# Patient Record
Sex: Male | Born: 1975 | Race: White | Hispanic: No | Marital: Married | Smoking: Never smoker
Health system: Southern US, Community
[De-identification: ages and names within clinical notes are randomized; demographics above are authoritative.]

## PROBLEM LIST (undated history)

## (undated) DIAGNOSIS — K7460 Unspecified cirrhosis of liver: Secondary | ICD-10-CM

## (undated) DIAGNOSIS — J45909 Unspecified asthma, uncomplicated: Secondary | ICD-10-CM

## (undated) DIAGNOSIS — K766 Portal hypertension: Secondary | ICD-10-CM

## (undated) DIAGNOSIS — E119 Type 2 diabetes mellitus without complications: Secondary | ICD-10-CM

## (undated) DIAGNOSIS — D696 Thrombocytopenia, unspecified: Secondary | ICD-10-CM

## (undated) DIAGNOSIS — N281 Cyst of kidney, acquired: Secondary | ICD-10-CM

## (undated) DIAGNOSIS — Z8489 Family history of other specified conditions: Secondary | ICD-10-CM

## (undated) DIAGNOSIS — I8500 Esophageal varices without bleeding: Secondary | ICD-10-CM

## (undated) DIAGNOSIS — I10 Essential (primary) hypertension: Secondary | ICD-10-CM

## (undated) HISTORY — DX: Cyst of kidney, acquired: N28.1

## (undated) HISTORY — DX: Portal hypertension: K76.6

## (undated) HISTORY — DX: Essential (primary) hypertension: I10

## (undated) HISTORY — DX: Thrombocytopenia, unspecified: D69.6

## (undated) HISTORY — DX: Unspecified cirrhosis of liver: K74.60

## (undated) HISTORY — DX: Esophageal varices without bleeding: I85.00

## (undated) HISTORY — PX: UPPER GASTROINTESTINAL ENDOSCOPY: SHX188

## (undated) HISTORY — PX: HERNIA REPAIR: SHX51

## (undated) HISTORY — PX: WISDOM TOOTH EXTRACTION: SHX21

## (undated) HISTORY — DX: Unspecified asthma, uncomplicated: J45.909

## (undated) HISTORY — PX: COLONOSCOPY: SHX174

## (undated) HISTORY — DX: Type 2 diabetes mellitus without complications: E11.9

---

## 2000-05-18 ENCOUNTER — Encounter: Payer: Self-pay | Admitting: Emergency Medicine

## 2000-05-18 ENCOUNTER — Emergency Department (HOSPITAL_COMMUNITY): Admission: EM | Admit: 2000-05-18 | Discharge: 2000-05-18 | Payer: Self-pay | Admitting: Emergency Medicine

## 2008-05-16 ENCOUNTER — Encounter: Admission: RE | Admit: 2008-05-16 | Discharge: 2008-08-14 | Payer: Self-pay | Admitting: Occupational Medicine

## 2008-06-19 ENCOUNTER — Encounter: Admission: RE | Admit: 2008-06-19 | Discharge: 2008-06-20 | Payer: Self-pay | Admitting: Sports Medicine

## 2011-01-05 ENCOUNTER — Other Ambulatory Visit: Payer: Self-pay | Admitting: Family Medicine

## 2011-01-08 ENCOUNTER — Ambulatory Visit
Admission: RE | Admit: 2011-01-08 | Discharge: 2011-01-08 | Disposition: A | Discharge status: Home / Self Care | Payer: Managed Care, Other (non HMO) | Source: Ambulatory Visit | Attending: Family Medicine | Admitting: Family Medicine

## 2011-04-30 ENCOUNTER — Ambulatory Visit (INDEPENDENT_AMBULATORY_CARE_PROVIDER_SITE_OTHER): Payer: Managed Care, Other (non HMO) | Admitting: Internal Medicine

## 2011-04-30 DIAGNOSIS — H6120 Impacted cerumen, unspecified ear: Secondary | ICD-10-CM

## 2011-04-30 DIAGNOSIS — H918X9 Other specified hearing loss, unspecified ear: Secondary | ICD-10-CM

## 2011-04-30 DIAGNOSIS — H9209 Otalgia, unspecified ear: Secondary | ICD-10-CM

## 2011-05-01 NOTE — Progress Notes (Signed)
  Subjective:    Patient ID: Caleb Baker, male    DOB: 1975-07-29, 36 y.o.   MRN: [id number]  HPICan't hear out of right ear for the last 24 hours/feels like there is a problem on the left but not as bad Pressure but no real pain on the right/no history of recent infections or allergies Has had an episode like this in the past whe're was due to wax    Review of Systems     Objective:   Physical Exam Vital signs are stable and his person on lisinopril for hypertension except for weight 242 pounds The right external auditory canal is occluded by wax totally The left canal is partially occluded with an abundance of wax      Procedure: Both ears were irrigated successfully Postprocedure  exam showed both canals to be clear and tympanic membranes to be intact Assessment & Plan:  Problem #1 cerumen impaction causing ear symptoms  Since this is a recurrent problem I advised 4 drops of mineral oil in each ear once a week

## 2011-09-24 ENCOUNTER — Ambulatory Visit: Payer: Managed Care, Other (non HMO)

## 2011-09-24 ENCOUNTER — Ambulatory Visit (INDEPENDENT_AMBULATORY_CARE_PROVIDER_SITE_OTHER): Payer: Managed Care, Other (non HMO) | Admitting: Emergency Medicine

## 2011-09-24 VITALS — BP 122/81 | HR 86 | Temp 98.6°F | Resp 16 | Ht 72.5 in | Wt 260.0 lb

## 2011-09-24 DIAGNOSIS — M25539 Pain in unspecified wrist: Secondary | ICD-10-CM

## 2011-09-24 DIAGNOSIS — S63509A Unspecified sprain of unspecified wrist, initial encounter: Secondary | ICD-10-CM

## 2011-09-24 MED ORDER — NAPROXEN SODIUM 550 MG PO TABS: 550.0000 mg | ORAL_TABLET | Freq: Two times a day (BID) | ORAL | Status: AC

## 2011-09-24 NOTE — Progress Notes (Signed)
  Date:  09/24/2011   Name:  Caleb Baker   DOB:  1975-10-21   MRN:  [id number] Gender: male Age: 36 y.o.  PCP:  No primary provider on file.    Chief Complaint: Wrist Pain   History of Present Illness:  Caleb Baker is a 36 y.o. pleasant patient who presents with the following:  Injured Monday while swinging a golf club.  The club head struck a root instead of the ball and he has experienced pain in his dorsal wrist since that time.  Pain mostly in the ulnar aspect of the lateral wrist but swelling is reduced.  More pain when he picks up objects.  Denies other complaints or prior injury.  There is no problem list on file for this patient.   No past medical history on file.  No past surgical history on file.  History  Substance Use Topics  . Smoking status: Never Smoker   . Smokeless tobacco: Not on file  . Alcohol Use: Not on file    No family history on file.  No Known Allergies  Medication list has been reviewed and updated.  Current Outpatient Prescriptions on File Prior to Visit  Medication Sig Dispense Refill  . lisinopril (PRINIVIL,ZESTRIL) 10 MG tablet Take 10 mg by mouth daily.        Review of Systems:  As per HPI, otherwise negative.    Physical Examination: Filed Vitals:   09/24/11 1246  BP: 122/81  Pulse: 86  Temp: 98.6 F (37 C)  Resp: 16   Filed Vitals:   09/24/11 1246  Height: 6' 0.5" (1.842 m)  Weight: 260 lb (117.935 kg)   Body mass index is 34.78 kg/(m^2). Ideal Body Weight: Weight in (lb) to have BMI = 25: 186.5    GEN: WDWN, NAD, Non-toxic, Alert & Oriented x 3 HEENT: Atraumatic, Normocephalic.  Ears and Nose: No external deformity. EXTR: No clubbing/cyanosis/edema NEURO: Normal gait.  PSYCH: Normally interactive. Conversant. Not depressed or anxious appearing.  Calm demeanor.  Right Wrist:  Tender over ulnar styloid guard passive movement.  No deformity or ecchymosis.  Some swelling.  Assessment and Plan: Wrist  sprain Cockup splint Anaprox Ice Follow up in one week if still having pain   Carmelina Dane, MD  [facility] reading (PRIMARY) by  Dr. Dareen Piano.  Negative wrist.  Unusual oblique line in distal radius but I don't see a fracture.

## 2011-09-24 NOTE — Patient Instructions (Signed)
Wrist Pain Wrist injuries are frequent in adults and children. A sprain is an injury to the ligaments that hold your bones together. A strain is an injury to muscle or muscle cord-like structures (tendons) from stretching or pulling. Generally, when wrists are moderately tender to touch following a fall or injury, a break in the bone (fracture) may be present. Most wrist sprains or strains are better in 3 to 5 days, but complete healing may take several weeks. HOME CARE INSTRUCTIONS   Put ice on the injured area.   Put ice in a plastic bag.   Place a towel between your skin and the bag.   Leave the ice on for 15 to 20 minutes, 3 to 4 times a day, for the first 2 days.   Keep your arm raised above the level of your heart whenever possible to reduce swelling and pain.   Rest the injured area for at least 48 hours or as directed by your caregiver.   If a splint or elastic bandage has been applied, use it for as long as directed by your caregiver or until seen by a caregiver for a follow-up exam.   Only take over-the-counter or prescription medicines for pain, discomfort, or fever as directed by your caregiver.   Keep all follow-up appointments. You may need to follow up with a specialist or have follow-up X-rays. Improvement in pain level is not a guarantee that you did not fracture a bone in your wrist. The only way to determine whether or not you have a broken bone is by X-ray.  SEEK IMMEDIATE MEDICAL CARE IF:   Your fingers are swollen, very red, white, or cold and blue.   Your fingers are numb or tingling.   You have increasing pain.   You have difficulty moving your fingers.  MAKE SURE YOU:   Understand these instructions.   Will watch your condition.   Will get help right away if you are not doing well or get worse.  Document Released: 10/14/2004 Document Revised: 12/24/2010 Document Reviewed: 02/25/2010 [employer] Patient Information 2012 [employer]. 

## 2015-09-16 ENCOUNTER — Other Ambulatory Visit: Payer: Self-pay | Admitting: Family Medicine

## 2015-09-16 DIAGNOSIS — N492 Inflammatory disorders of scrotum: Secondary | ICD-10-CM

## 2015-09-17 ENCOUNTER — Other Ambulatory Visit: Payer: Self-pay | Admitting: Family Medicine

## 2015-09-17 DIAGNOSIS — N492 Inflammatory disorders of scrotum: Secondary | ICD-10-CM

## 2015-09-23 ENCOUNTER — Other Ambulatory Visit: Payer: Managed Care, Other (non HMO)

## 2015-09-23 ENCOUNTER — Ambulatory Visit
Admission: RE | Admit: 2015-09-23 | Discharge: 2015-09-23 | Disposition: A | Discharge status: Home / Self Care | Payer: Managed Care, Other (non HMO) | Source: Ambulatory Visit | Attending: Family Medicine | Admitting: Family Medicine

## 2015-09-23 ENCOUNTER — Encounter (INDEPENDENT_AMBULATORY_CARE_PROVIDER_SITE_OTHER): Payer: Self-pay

## 2015-09-23 DIAGNOSIS — N492 Inflammatory disorders of scrotum: Secondary | ICD-10-CM

## 2015-09-24 ENCOUNTER — Other Ambulatory Visit: Payer: Managed Care, Other (non HMO)

## 2018-03-17 DIAGNOSIS — J111 Influenza due to unidentified influenza virus with other respiratory manifestations: Secondary | ICD-10-CM | POA: Diagnosis not present

## 2018-05-01 DIAGNOSIS — E119 Type 2 diabetes mellitus without complications: Secondary | ICD-10-CM | POA: Diagnosis not present

## 2018-05-01 DIAGNOSIS — I10 Essential (primary) hypertension: Secondary | ICD-10-CM | POA: Diagnosis not present

## 2018-12-06 DIAGNOSIS — Z125 Encounter for screening for malignant neoplasm of prostate: Secondary | ICD-10-CM | POA: Diagnosis not present

## 2018-12-06 DIAGNOSIS — E119 Type 2 diabetes mellitus without complications: Secondary | ICD-10-CM | POA: Diagnosis not present

## 2018-12-06 DIAGNOSIS — Z6835 Body mass index (BMI) 35.0-35.9, adult: Secondary | ICD-10-CM | POA: Diagnosis not present

## 2018-12-06 DIAGNOSIS — I10 Essential (primary) hypertension: Secondary | ICD-10-CM | POA: Diagnosis not present

## 2019-02-07 DIAGNOSIS — Z20828 Contact with and (suspected) exposure to other viral communicable diseases: Secondary | ICD-10-CM | POA: Diagnosis not present

## 2019-02-07 DIAGNOSIS — Z03818 Encounter for observation for suspected exposure to other biological agents ruled out: Secondary | ICD-10-CM | POA: Diagnosis not present

## 2019-05-30 DIAGNOSIS — I10 Essential (primary) hypertension: Secondary | ICD-10-CM | POA: Diagnosis not present

## 2019-05-30 DIAGNOSIS — R945 Abnormal results of liver function studies: Secondary | ICD-10-CM | POA: Diagnosis not present

## 2019-05-30 DIAGNOSIS — E1165 Type 2 diabetes mellitus with hyperglycemia: Secondary | ICD-10-CM | POA: Diagnosis not present

## 2019-05-30 DIAGNOSIS — D696 Thrombocytopenia, unspecified: Secondary | ICD-10-CM | POA: Diagnosis not present

## 2019-06-06 DIAGNOSIS — E1165 Type 2 diabetes mellitus with hyperglycemia: Secondary | ICD-10-CM | POA: Diagnosis not present

## 2019-06-06 DIAGNOSIS — D696 Thrombocytopenia, unspecified: Secondary | ICD-10-CM | POA: Diagnosis not present

## 2019-06-06 DIAGNOSIS — R945 Abnormal results of liver function studies: Secondary | ICD-10-CM | POA: Diagnosis not present

## 2019-06-22 ENCOUNTER — Telehealth: Payer: Self-pay | Admitting: Hematology and Oncology

## 2019-06-22 NOTE — Telephone Encounter (Signed)
Received a new hem referral from Dr. Shirline Frees for dx of thrombocytopenia. Caleb Baker has been cld and schedueld to see Dr. Lindi Adie on 6/9 at 345pm. Pt aware to arrive 15 minutes early.

## 2019-06-26 NOTE — Progress Notes (Signed)
[facility] [facility] CONSULT NOTE  Patient Care Team: Shirline Frees, MD as PCP - General (Family Medicine)  CHIEF COMPLAINTS/PURPOSE OF CONSULTATION:  Newly diagnosed thrombocytopenia and leukopenia  HISTORY OF PRESENTING ILLNESS:  Caleb Baker 44 y.o. male is here because of recent diagnosis of thrombocytopenia and leukopenia. She is referred by Dr. Shirline Frees. Labs on 06/06/19 showed WBC 3.7, ANC 2.2, ALC 1.0, platelets 76. He presents to the clinic today for initial evaluation.  He denies any bleeding symptoms. He is overweight and is on metformin and lisinopril.   I reviewed his records extensively and collaborated the history with the patient.  MEDICAL HISTORY:  DM and HTN SURGICAL HISTORY: No prior surgeries   SOCIAL HISTORY:Drinks on weekend Social History   Socioeconomic History  . Marital status: Married    Spouse name: Not on file  . Number of children: Not on file  . Years of education: Not on file  . Highest education level: Not on file  Occupational History  . Not on file  Tobacco Use  . Smoking status: Never Smoker  Substance and Sexual Activity  . Alcohol use: Not on file  . Drug use: Not on file  . Sexual activity: Not on file  Other Topics Concern  . Not on file  Social History Narrative  . Not on file   Social Determinants of Health   Financial Resource Strain:   . Difficulty of Paying Living Expenses:   Food Insecurity:   . Worried About Charity fundraiser in the Last Year:   . Arboriculturist in the Last Year:   Transportation Needs:   . Film/video editor (Medical):   Marland Kitchen Lack of Transportation (Non-Medical):   Physical Activity:   . Days of Exercise per Week:   . Minutes of Exercise per Session:   Stress:   . Feeling of Stress :   Social Connections:   . Frequency of Communication with Friends and Family:   . Frequency of Social Gatherings with Friends and Family:   . Attends Religious Services:   . Active Member of  Clubs or Organizations:   . Attends Archivist Meetings:   Marland Kitchen Marital Status:   Intimate Partner Violence:   . Fear of Current or Ex-Partner:   . Emotionally Abused:   Marland Kitchen Physically Abused:   . Sexually Abused:     FAMILY HISTORY: No family history on file.  ALLERGIES:  has No Known Allergies.  MEDICATIONS:  Current Outpatient Medications  Medication Sig Dispense Refill  . lisinopril (PRINIVIL,ZESTRIL) 10 MG tablet Take 10 mg by mouth daily.     No current facility-administered medications for this visit.    REVIEW OF SYSTEMS:   Constitutional: Denies fevers, chills or abnormal night sweats Eyes: Denies blurriness of vision, double vision or watery eyes Ears, nose, mouth, throat, and face: Denies mucositis or sore throat Respiratory: Denies cough, dyspnea or wheezes Cardiovascular: Denies palpitation, chest discomfort or lower extremity swelling Gastrointestinal:  Denies nausea, heartburn or change in bowel habits Skin: Denies abnormal skin rashes Lymphatics: Denies new lymphadenopathy or easy bruising Neurological:Denies numbness, tingling or new weaknesses Behavioral/Psych: Mood is stable, no new changes  All other systems were reviewed with the patient and are negative.  PHYSICAL EXAMINATION: ECOG PERFORMANCE STATUS: 0 - Asymptomatic  Vitals:   06/27/19 1534  BP: (!) 161/93  Pulse: (!) 106  Resp: (!) 22  Temp: 99.6 F (37.6 C)  SpO2: 99%   Filed Weights  06/27/19 1534  Weight: 275 lb 9.6 oz (125 kg)    GENERAL:alert, no distress and comfortable SKIN: skin color, texture, turgor are normal, no rashes or significant lesions EYES: normal, conjunctiva are pink and non-injected, sclera clear OROPHARYNX:no exudate, no erythema and lips, buccal mucosa, and tongue normal  NECK: supple, thyroid normal size, non-tender, without nodularity LYMPH:  no palpable lymphadenopathy in the cervical, axillary or inguinal LUNGS: clear to auscultation and percussion  with normal breathing effort HEART: regular rate & rhythm and no murmurs and no lower extremity edema ABDOMEN:abdomen soft, non-tender and normal bowel sounds Musculoskeletal:no cyanosis of digits and no clubbing  PSYCH: alert & oriented x 3 with fluent speech NEURO: no focal motor/sensory deficits  LABORATORY DATA:  I have reviewed the data as listed No results found for: WBC, HGB, HCT, MCV, PLT No results found for: NA, K, CL, CO2  RADIOGRAPHIC STUDIES: I have personally reviewed the radiological reports and agreed with the findings in the report.  ASSESSMENT AND PLAN:  Thrombocytopenia ([place]) 06/06/2019: WBC 3.7, platelets 76, MPV 11.1 12/06/2018: WBC 5.9, ANC 3.7, platelets 97, MPV 11.1  Mild thrombocytopenia:  Differential diagnosis: 1. Low-grade ITP 2. medication induced: On further review patient is not taking any medications that are associated with thrombocytopenia 3. Bone marrow factors 4. Hepatitis B/C 5. Splenomegaly: No enlarged spleen was palpable to physical exam.  I discussed with the patient that the level of thrombocytopenia is very mild and that these levels, there are usually no adverse effects. There is usually no risk of bleeding and hence it can be observed without making any changes to patient's medications or requiring any further investigations like bone marrow biopsies.  Labs: CBC in a blue top tube, mean platelet fraction (to help differentiate between decreased production versus increased destruction), hepatitis B and C testing and ultrasound of the liver and spleen I recommended watchful monitoring.  I will call him with the results of these tests    All questions were answered. The patient knows to call the clinic with any problems, questions or concerns.   Rulon Eisenmenger, MD, MPH 06/27/2019    I, Molly Dorshimer, am acting as scribe for Nicholas Lose, MD.  I have reviewed the above documentation for accuracy and completeness, and I agree with the  above.

## 2019-06-27 ENCOUNTER — Other Ambulatory Visit: Payer: Self-pay

## 2019-06-27 ENCOUNTER — Inpatient Hospital Stay: Payer: BC Managed Care – PPO | Attending: Hematology and Oncology | Admitting: Hematology and Oncology

## 2019-06-27 ENCOUNTER — Inpatient Hospital Stay: Payer: BC Managed Care – PPO

## 2019-06-27 DIAGNOSIS — E119 Type 2 diabetes mellitus without complications: Secondary | ICD-10-CM | POA: Insufficient documentation

## 2019-06-27 DIAGNOSIS — D72819 Decreased white blood cell count, unspecified: Secondary | ICD-10-CM | POA: Diagnosis not present

## 2019-06-27 DIAGNOSIS — I10 Essential (primary) hypertension: Secondary | ICD-10-CM | POA: Insufficient documentation

## 2019-06-27 DIAGNOSIS — D691 Qualitative platelet defects: Secondary | ICD-10-CM | POA: Insufficient documentation

## 2019-06-27 DIAGNOSIS — D696 Thrombocytopenia, unspecified: Secondary | ICD-10-CM

## 2019-06-27 LAB — CBC WITH DIFFERENTIAL (CANCER CENTER ONLY)
Abs Immature Granulocytes: 0 10*3/uL (ref 0.00–0.07)
Basophils Absolute: 0 10*3/uL (ref 0.0–0.1)
Basophils Relative: 1 %
Eosinophils Absolute: 0.2 10*3/uL (ref 0.0–0.5)
Eosinophils Relative: 4 %
HCT: 43 % (ref 39.0–52.0)
Hemoglobin: 15.2 g/dL (ref 13.0–17.0)
Immature Granulocytes: 0 %
Lymphocytes Relative: 24 %
Lymphs Abs: 0.9 10*3/uL (ref 0.7–4.0)
MCH: 32.7 pg (ref 26.0–34.0)
MCHC: 35.3 g/dL (ref 30.0–36.0)
MCV: 92.5 fL (ref 80.0–100.0)
Monocytes Absolute: 0.3 10*3/uL (ref 0.1–1.0)
Monocytes Relative: 9 %
Neutro Abs: 2.2 10*3/uL (ref 1.7–7.7)
Neutrophils Relative %: 62 %
Platelet Count: 69 10*3/uL — ABNORMAL LOW (ref 150–400)
RBC: 4.65 MIL/uL (ref 4.22–5.81)
RDW: 12.6 % (ref 11.5–15.5)
WBC Count: 3.6 10*3/uL — ABNORMAL LOW (ref 4.0–10.5)
nRBC: 0 % (ref 0.0–0.2)

## 2019-06-27 LAB — VITAMIN B12: Vitamin B-12: 339 pg/mL (ref 180–914)

## 2019-06-27 LAB — IMMATURE PLATELET FRACTION: Immature Platelet Fraction: 9.4 % — ABNORMAL HIGH (ref 1.2–8.6)

## 2019-06-27 LAB — FOLATE: Folate: 10.9 ng/mL (ref >5.9)

## 2019-06-27 LAB — PLATELET BY CITRATE

## 2019-06-27 LAB — HEPATITIS C ANTIBODY: HCV Ab: NONREACTIVE

## 2019-06-27 LAB — HEPATITIS B SURFACE ANTIGEN: Hepatitis B Surface Ag: NONREACTIVE

## 2019-06-27 NOTE — Progress Notes (Signed)
  HEMATOLOGY-ONCOLOGY TELEPHONE VISIT PROGRESS NOTE  I connected with Caleb Baker on 06/28/2019 at  1:30 PM EDT by telephone and verified that I am speaking with the correct person using two identifiers.  I discussed the limitations, risks, security and privacy concerns of performing an evaluation and management service by telephone and the availability of in person appointments.  I also discussed with the patient that there may be a patient responsible charge related to this service. The patient expressed understanding and agreed to proceed.   History of Present Illness: Caleb Baker is a 44 y.o. male with above-mentioned history of thrombocytopenia and leukopenia. Labs on 06/27/19 showed WBC 3.6, ANC 2.2, platelets 69, immature platelet fraction 9.4, folate 10.9, B-12 339, HepB surface antigen negative, HCV antibody negative. He is over the phone today to review his labs.   Observations/Objective:     Assessment Plan:  Thrombocytopenia ([place]) Lab review: WBC 3.6, platelets 69, hemoglobin 15.2, hepatitis B and hepatitis C: Negative Immature platelet fraction: 9.4% (elevated), B12 339, folate 10.9 Ultrasound of the abdomen is pending Based on elevated immature platelet fraction I suspect ITP versus splenic sequestration. Since his platelet count is still over 50,000 he does not need any immediate treatment.  However his platelet count has declined in the last several months from 96 to 40  We will recheck him in 2 months with labs and follow-up    I discussed the assessment and treatment plan with the patient. The patient was provided an opportunity to ask questions and all were answered. The patient agreed with the plan and demonstrated an understanding of the instructions. The patient was advised to call back or seek an in-person evaluation if the symptoms worsen or if the condition fails to improve as anticipated.   I provided 20 minutes of telephone conversation along with reviewing the  blood smear charting and coordination of care  Rulon Eisenmenger, MD 06/28/2019    I, Cloyde Reams Dorshimer, am acting as scribe for Nicholas Lose, MD.  I have reviewed the above documentation for accuracy and completeness, and I agree with the above.

## 2019-06-27 NOTE — Assessment & Plan Note (Signed)
06/06/2019: WBC 3.7, platelets 76, MPV 11.1 12/06/2018: WBC 5.9, ANC 3.7, platelets 97, MPV 11.1  Mild thrombocytopenia:  Differential diagnosis: 1. Low-grade ITP 2. medication induced: On further review patient is not taking any medications that are associated with thrombocytopenia 3. Bone marrow factors 4. Hepatitis B/C 5. Splenomegaly: No enlarged spleen was palpable to physical exam.  I discussed with the patient that the level of thrombocytopenia is very mild and that these levels, there are usually no adverse effects. There is usually no risk of bleeding and hence it can be observed without making any changes to patient's medications or requiring any further investigations like bone marrow biopsies.  Labs: CBC in a blue top tube, mean platelet fraction (to help differentiate between decreased production versus increased destruction), hepatitis B and C testing and ultrasound of the liver and spleen I recommended watchful monitoring.  I will call him with the results of these tests

## 2019-06-28 ENCOUNTER — Inpatient Hospital Stay (HOSPITAL_BASED_OUTPATIENT_CLINIC_OR_DEPARTMENT_OTHER): Payer: BC Managed Care – PPO | Admitting: Hematology and Oncology

## 2019-06-28 DIAGNOSIS — D696 Thrombocytopenia, unspecified: Secondary | ICD-10-CM | POA: Diagnosis not present

## 2019-06-28 NOTE — Assessment & Plan Note (Signed)
Lab review: WBC 3.6, platelets 69, hemoglobin 15.2, hepatitis B and hepatitis C: Negative Immature platelet fraction: 9.4% (elevated), B12 339, folate 10.9 Ultrasound of the abdomen is pending Based on elevated immature platelet fraction I suspect ITP versus splenic sequestration. Since his platelet count is still over 50,000 he does not need any immediate treatment.  However his platelet count has declined in the last several months from 96 to 59  We will recheck him in 2 months with labs and follow-up

## 2019-06-29 ENCOUNTER — Telehealth: Payer: Self-pay | Admitting: Hematology and Oncology

## 2019-06-29 NOTE — Telephone Encounter (Signed)
Scheduled per los, patient has been called and notified. 

## 2019-07-06 ENCOUNTER — Other Ambulatory Visit: Payer: Self-pay

## 2019-07-06 ENCOUNTER — Ambulatory Visit (HOSPITAL_COMMUNITY)
Admission: RE | Admit: 2019-07-06 | Discharge: 2019-07-06 | Disposition: A | Discharge status: Home / Self Care | Payer: BC Managed Care – PPO | Source: Ambulatory Visit | Attending: Hematology and Oncology | Admitting: Hematology and Oncology

## 2019-07-06 DIAGNOSIS — R161 Splenomegaly, not elsewhere classified: Secondary | ICD-10-CM | POA: Diagnosis not present

## 2019-07-06 DIAGNOSIS — D696 Thrombocytopenia, unspecified: Secondary | ICD-10-CM | POA: Diagnosis not present

## 2019-07-09 ENCOUNTER — Other Ambulatory Visit: Payer: Self-pay | Admitting: Hematology and Oncology

## 2019-07-09 ENCOUNTER — Telehealth: Payer: Self-pay | Admitting: Hematology and Oncology

## 2019-07-09 DIAGNOSIS — K769 Liver disease, unspecified: Secondary | ICD-10-CM

## 2019-07-09 NOTE — Telephone Encounter (Signed)
I discussed the liver ultrasound results with patient.  He has evidence of fatty liver with splenomegaly.  He also has a lesion in the liver and on the kidney.  Both of which although appear to be benign will need to be further evaluated by an MRI.  I sent an order for the MRI.  I will call him with the result of that test.

## 2019-07-30 ENCOUNTER — Other Ambulatory Visit: Payer: Self-pay

## 2019-07-30 ENCOUNTER — Ambulatory Visit
Admission: RE | Admit: 2019-07-30 | Discharge: 2019-07-30 | Disposition: A | Discharge status: Home / Self Care | Payer: No Typology Code available for payment source | Source: Ambulatory Visit | Attending: Hematology and Oncology | Admitting: Hematology and Oncology

## 2019-07-30 DIAGNOSIS — K7460 Unspecified cirrhosis of liver: Secondary | ICD-10-CM | POA: Diagnosis not present

## 2019-07-30 DIAGNOSIS — N281 Cyst of kidney, acquired: Secondary | ICD-10-CM | POA: Diagnosis not present

## 2019-07-30 DIAGNOSIS — K769 Liver disease, unspecified: Secondary | ICD-10-CM

## 2019-07-30 DIAGNOSIS — I8510 Secondary esophageal varices without bleeding: Secondary | ICD-10-CM | POA: Diagnosis not present

## 2019-07-30 DIAGNOSIS — K766 Portal hypertension: Secondary | ICD-10-CM | POA: Diagnosis not present

## 2019-07-30 MED ORDER — GADOBENATE DIMEGLUMINE 529 MG/ML IV SOLN
20.0000 mL | Freq: Once | INTRAVENOUS | Status: AC | PRN
  Administered 2019-07-30: 20 mL via INTRAVENOUS

## 2019-08-08 ENCOUNTER — Other Ambulatory Visit: Payer: Self-pay | Admitting: *Deleted

## 2019-08-08 ENCOUNTER — Telehealth: Payer: Self-pay | Admitting: *Deleted

## 2019-08-08 ENCOUNTER — Telehealth: Payer: Self-pay | Admitting: Hematology and Oncology

## 2019-08-08 ENCOUNTER — Encounter: Payer: Self-pay | Admitting: Physician Assistant

## 2019-08-08 DIAGNOSIS — K769 Liver disease, unspecified: Secondary | ICD-10-CM

## 2019-08-08 NOTE — Telephone Encounter (Signed)
GI referral was put in place to [facility]. Called office to f/u with referral. Scheduler will give pt a call.

## 2019-08-08 NOTE — Telephone Encounter (Signed)
I discussed the results of the abdominal MRI which showed cirrhosis of the liver with splenomegaly and portal hypertension.  This is the cause of his thrombocytopenia.  We will consult gastroenterology for further evaluation.  Patient tells me that he does not drink much alcohol and that he is very surprised about it.  I discussed with him that there are other reasons for liver cirrhosis.

## 2019-08-27 NOTE — Progress Notes (Signed)
   Patient Care Team: Shirline Frees, MD as PCP - General (Family Medicine)  DIAGNOSIS:    ICD-10-CM   1. Liver disease  K76.9 CBC with Differential ([place])    [place] ([place] only)  2. Thrombocytopenia ([place])  D69.6 CBC with Differential ([place])    CMP ([place] only)    CHIEF COMPLIANT: Follow-up of thrombocytopenia and leukopenia  INTERVAL HISTORY: Caleb Baker is a 44 y.o. with above-mentioned history of thrombocytopenia and leukopenia. Abdominal MRI on 07/30/19 showed cirrhosis of the liver with splenomegaly and portal hypertension. He presents to the clinic today for follow-up.   ALLERGIES:  has No Known Allergies.  MEDICATIONS:  Current Outpatient Medications  Medication Sig Dispense Refill  . lisinopril (PRINIVIL,ZESTRIL) 10 MG tablet Take 10 mg by mouth daily.     No current facility-administered medications for this visit.    PHYSICAL EXAMINATION: ECOG PERFORMANCE STATUS: 1 - Symptomatic but completely ambulatory  Vitals:   08/28/19 1325  BP: 135/82  Pulse: 83  Resp: 18  Temp: 98.1 F (36.7 C)  SpO2: 100%   Filed Weights   08/28/19 1325  Weight: 272 lb 6.4 oz (123.6 kg)    LABORATORY DATA:  I have reviewed the data as listed No flowsheet data found.  Lab Results  Component Value Date   WBC 3.9 (L) 08/28/2019   HGB 14.7 08/28/2019   HCT 41.6 08/28/2019   MCV 90.0 08/28/2019   PLT 70 (L) 08/28/2019   NEUTROABS 2.2 08/28/2019    ASSESSMENT & PLAN:  Thrombocytopenia ([facility]) Lab review: WBC 3.6, platelets 69, hemoglobin 15.2, hepatitis B and hepatitis C: Negative Immature platelet fraction: 9.4% (elevated), B12 339, folate 10.9 Ultrasound of the abdomen suggested cirrhosis of the liver  Differential diagnosis is ITP versus clinic sequestration  Since his platelet count is still over 50,000 he does not need any immediate treatment.  However his platelet count has declined in the last several months from 96 to  69  08/28/2019: Platelet count 70: Stable I discussed with him that if he notices any excessive bruising or bleeding to call us sooner. He has a gastroenterology appointment on 09/17/2019.  We will recheck him in 6 months with labs and follow-up    Orders Placed This Encounter  Procedures  . CBC with Differential ([facility] Only)    Standing Status:   Future    Standing Expiration Date:   08/27/2020  . CMP ([place] only)    Standing Status:   Future    Standing Expiration Date:   08/27/2020   The patient has a good understanding of the overall plan. he agrees with it. he will call with any problems that may develop before the next visit here.  Total time spent: 20 mins including face to face time and time spent for planning, charting and coordination of care  Caleb Lose, MD 08/28/2019  I, Cloyde Reams Dorshimer, am acting as scribe for Dr. Nicholas Baker.  I have reviewed the above documentation for accuracy and completeness, and I agree with the above.

## 2019-08-28 ENCOUNTER — Inpatient Hospital Stay (HOSPITAL_BASED_OUTPATIENT_CLINIC_OR_DEPARTMENT_OTHER): Payer: BC Managed Care – PPO | Admitting: Hematology and Oncology

## 2019-08-28 ENCOUNTER — Inpatient Hospital Stay: Payer: BC Managed Care – PPO | Attending: Hematology and Oncology

## 2019-08-28 ENCOUNTER — Other Ambulatory Visit: Payer: Self-pay

## 2019-08-28 VITALS — BP 135/82 | HR 83 | Temp 98.1°F | Resp 18 | Ht 72.5 in | Wt 272.4 lb

## 2019-08-28 DIAGNOSIS — K7460 Unspecified cirrhosis of liver: Secondary | ICD-10-CM | POA: Insufficient documentation

## 2019-08-28 DIAGNOSIS — D696 Thrombocytopenia, unspecified: Secondary | ICD-10-CM

## 2019-08-28 DIAGNOSIS — K769 Liver disease, unspecified: Secondary | ICD-10-CM

## 2019-08-28 DIAGNOSIS — Z79899 Other long term (current) drug therapy: Secondary | ICD-10-CM | POA: Diagnosis not present

## 2019-08-28 DIAGNOSIS — R161 Splenomegaly, not elsewhere classified: Secondary | ICD-10-CM | POA: Diagnosis not present

## 2019-08-28 LAB — CBC WITH DIFFERENTIAL (CANCER CENTER ONLY)
Abs Immature Granulocytes: 0.01 10*3/uL (ref 0.00–0.07)
Basophils Absolute: 0 10*3/uL (ref 0.0–0.1)
Basophils Relative: 1 %
Eosinophils Absolute: 0.1 10*3/uL (ref 0.0–0.5)
Eosinophils Relative: 4 %
HCT: 41.6 % (ref 39.0–52.0)
Hemoglobin: 14.7 g/dL (ref 13.0–17.0)
Immature Granulocytes: 0 %
Lymphocytes Relative: 31 %
Lymphs Abs: 1.2 10*3/uL (ref 0.7–4.0)
MCH: 31.8 pg (ref 26.0–34.0)
MCHC: 35.3 g/dL (ref 30.0–36.0)
MCV: 90 fL (ref 80.0–100.0)
Monocytes Absolute: 0.4 10*3/uL (ref 0.1–1.0)
Monocytes Relative: 9 %
Neutro Abs: 2.2 10*3/uL (ref 1.7–7.7)
Neutrophils Relative %: 55 %
Platelet Count: 70 10*3/uL — ABNORMAL LOW (ref 150–400)
RBC: 4.62 MIL/uL (ref 4.22–5.81)
RDW: 12 % (ref 11.5–15.5)
WBC Count: 3.9 10*3/uL — ABNORMAL LOW (ref 4.0–10.5)
nRBC: 0 % (ref 0.0–0.2)

## 2019-08-28 NOTE — Assessment & Plan Note (Signed)
Lab review: WBC 3.6, platelets 69, hemoglobin 15.2, hepatitis B and hepatitis C: Negative Immature platelet fraction: 9.4% (elevated), B12 339, folate 10.9 Ultrasound of the abdomen suggested cirrhosis of the liver  Differential diagnosis is ITP versus clinic sequestration  Since his platelet count is still over 50,000 he does not need any immediate treatment.  However his platelet count has declined in the last several months from 96 to 69  08/28/2019: Platelet count 70: Stable I discussed with him that if he notices any excessive bruising or bleeding to call us sooner. He has a gastroenterology appointment on 09/17/2019.  We will recheck him in 6 months with labs and follow-up

## 2019-09-17 ENCOUNTER — Ambulatory Visit (INDEPENDENT_AMBULATORY_CARE_PROVIDER_SITE_OTHER): Payer: BC Managed Care – PPO | Admitting: Physician Assistant

## 2019-09-17 ENCOUNTER — Encounter: Payer: Self-pay | Admitting: Physician Assistant

## 2019-09-17 ENCOUNTER — Other Ambulatory Visit (INDEPENDENT_AMBULATORY_CARE_PROVIDER_SITE_OTHER): Payer: BC Managed Care – PPO

## 2019-09-17 VITALS — BP 136/80 | HR 98 | Ht 73.0 in | Wt 273.0 lb

## 2019-09-17 DIAGNOSIS — K7460 Unspecified cirrhosis of liver: Secondary | ICD-10-CM

## 2019-09-17 DIAGNOSIS — K766 Portal hypertension: Secondary | ICD-10-CM

## 2019-09-17 DIAGNOSIS — Z1211 Encounter for screening for malignant neoplasm of colon: Secondary | ICD-10-CM

## 2019-09-17 LAB — COMPREHENSIVE METABOLIC PANEL
ALT: 32 U/L (ref 0–53)
AST: 30 U/L (ref 0–37)
Albumin: 3.9 g/dL (ref 3.5–5.2)
Alkaline Phosphatase: 230 U/L — ABNORMAL HIGH (ref 39–117)
BUN: 14 mg/dL (ref 6–23)
CO2: 27 mEq/L (ref 19–32)
Calcium: 9.1 mg/dL (ref 8.4–10.5)
Chloride: 100 mEq/L (ref 96–112)
Creatinine, Ser: 0.9 mg/dL (ref 0.40–1.50)
GFR: 91.22 mL/min (ref >60.00)
Glucose, Bld: 256 mg/dL — ABNORMAL HIGH (ref 70–99)
Potassium: 3.8 mEq/L (ref 3.5–5.1)
Sodium: 135 mEq/L (ref 135–145)
Total Bilirubin: 2.1 mg/dL — ABNORMAL HIGH (ref 0.2–1.2)
Total Protein: 6.9 g/dL (ref 6.0–8.3)

## 2019-09-17 LAB — CBC WITH DIFFERENTIAL/PLATELET
Basophils Absolute: 0 10*3/uL (ref 0.0–0.1)
Basophils Relative: 1 % (ref 0.0–3.0)
Eosinophils Absolute: 0.1 10*3/uL (ref 0.0–0.7)
Eosinophils Relative: 3.9 % (ref 0.0–5.0)
HCT: 41.3 % (ref 39.0–52.0)
Hemoglobin: 14.4 g/dL (ref 13.0–17.0)
Lymphocytes Relative: 27.1 % (ref 12.0–46.0)
Lymphs Abs: 1 10*3/uL (ref 0.7–4.0)
MCHC: 34.8 g/dL (ref 30.0–36.0)
MCV: 93 fl (ref 78.0–100.0)
Monocytes Absolute: 0.3 10*3/uL (ref 0.1–1.0)
Monocytes Relative: 7.2 % (ref 3.0–12.0)
Neutro Abs: 2.2 10*3/uL (ref 1.4–7.7)
Neutrophils Relative %: 60.8 % (ref 43.0–77.0)
Platelets: 64 10*3/uL — ABNORMAL LOW (ref 150.0–400.0)
RBC: 4.44 Mil/uL (ref 4.22–5.81)
RDW: 12.7 % (ref 11.5–15.5)
WBC: 3.6 10*3/uL — ABNORMAL LOW (ref 4.0–10.5)

## 2019-09-17 LAB — IBC + FERRITIN
Ferritin: 254.6 ng/mL (ref 22.0–322.0)
Iron: 90 ug/dL (ref 42–165)
Saturation Ratios: 23.8 % (ref 20.0–50.0)
Transferrin: 270 mg/dL (ref 212.0–360.0)

## 2019-09-17 LAB — PROTIME-INR
INR: 1.1 ratio — ABNORMAL HIGH (ref 0.8–1.0)
Prothrombin Time: 12.1 s (ref 9.6–13.1)

## 2019-09-17 MED ORDER — SUTAB 1479-225-188 MG PO TABS: 1.0000 | ORAL_TABLET | Freq: Once | ORAL | 0 refills | Status: AC

## 2019-09-17 NOTE — Progress Notes (Signed)
Chief Complaint: Cirrhosis  HPI:    Caleb Baker is a 44 year old male with a past medical history as listed below, who was referred to me by Shirline Frees, MD for a complaint of for cirrhosis.      01/08/2011 ultrasound of the abdomen with normal sonographic appearance of the gallbladder normal caliber common bile duct but mild diffuse fatty infiltration of the liver.    06/27/2019 B12 and folate normal.  Hepatitis C antibody nonreactive, hepatitis B surface antigen nonreactive.    07/06/2019 abdominal ultrasound done for thrombocytopenia and splenomegaly.  This showed heterogenous hyperechoic liver which likely represented diffuse fatty infiltration.  A 1.3 cm indeterminate lesion in the left lobe of the liver.  2.8 cm irregular lesion of the lower pole of the left kidney.  Splenomegaly.  Portal vein was unremarkable.    07/30/2019 MRI of the abdomen with and without contrast with hepatic cirrhosis, no evidence of hepatic neoplasm or other acute findings.  Small renal cyst.  Findings of portal venous hypertension including splenomegaly (measuring approximately 20 cm) and esophageal varices.  No evidence of ascites.    08/28/2019 CBC with a white count low at 3.9, platelets 70.    08/28/2019 patient followed with the cancer center in regards to thrombocytopenia and leukopenia.  Their differential diagnosis is ITP versus clinic sequestration.    Today, the patient tells me he feels perfectly fine but he had labs drawn back in November for his 35-month checkup due to being on medicines by his PCP and was found to have low platelets.  He then went to see the cancer center in June and since then has come up with a diagnosis of cirrhosis.  Explains to me that his father and grandfather both had cirrhosis but they were alcoholics.  He explained that due to this he really only drink when he was 27 to 44 years old and then stopped and over the past 2 to 3 years has drank may be a case of beer [place]  He does have a tattoo done at a reputable parlor.  Denies other hepatotoxic substances or history of IV drug use.    Denies fever, chills, weight loss, abdominal distention, peripheral edema, nausea, vomiting or abdominal pain.  Past Medical History:  Diagnosis Date  . Diabetes ([place])     History reviewed. No pertinent surgical history.  Current Outpatient Medications  Medication Sig Dispense Refill  . lisinopril (PRINIVIL,ZESTRIL) 10 MG tablet Take 10 mg by mouth daily.    . metFORMIN (GLUCOPHAGE) 850 MG tablet Take 850 mg by mouth 2 (two) times daily with a meal.     No current facility-administered medications for this visit.    Allergies as of 09/17/2019  . (No Known Allergies)    Family History  Problem Relation Age of Onset  . Colon cancer Neg Hx   . Stomach cancer Neg Hx     Social History   Socioeconomic History  . Marital status: Married    Spouse name: Not on file  . Number of children: Not on file  . Years of education: Not on file  . Highest education level: Not on file  Occupational History  . Not on file  Tobacco Use  . Smoking status: Never Smoker  . Smokeless tobacco: Former Network engineer  . Vaping Use: Never used  Substance and Sexual Activity  . Alcohol use: Yes    Comment: social  . Drug use: Never  .  Sexual activity: Not on file  Other Topics Concern  . Not on file  Social History Narrative  . Not on file   Social Determinants of Health   Financial Resource Strain:   . Difficulty of Paying Living Expenses: Not on file  Food Insecurity:   . Worried About Charity fundraiser in the Last Year: Not on file  . Ran Out of Food in the Last Year: Not on file  Transportation Needs:   . Lack of Transportation (Medical): Not on file  . Lack of Transportation (Non-Medical): Not on file  Physical Activity:   . Days of Exercise per Week: Not on file  . Minutes of Exercise per Session: Not on file  Stress:   . Feeling of Stress : Not  on file  Social Connections:   . Frequency of Communication with Friends and Family: Not on file  . Frequency of Social Gatherings with Friends and Family: Not on file  . Attends Religious Services: Not on file  . Active Member of Clubs or Organizations: Not on file  . Attends Archivist Meetings: Not on file  . Marital Status: Not on file  Intimate Partner Violence:   . Fear of Current or Ex-Partner: Not on file  . Emotionally Abused: Not on file  . Physically Abused: Not on file  . Sexually Abused: Not on file    Review of Systems:    Constitutional: No weight loss, fever or chills Skin: No rash  Cardiovascular: No chest pain Respiratory: No SOB  Gastrointestinal: See HPI and otherwise negative Genitourinary: No dysuria Neurological: No headache, dizziness or syncope Musculoskeletal: No new muscle or joint pain Hematologic: No bleeding  Psychiatric: No history of depression or anxiety   Physical Exam:  Vital signs: BP 136/80   Pulse 98   Ht 6\' 1"  (1.854 m)   Wt 273 lb (123.8 kg)   BMI 36.02 kg/m   Constitutional:   Pleasant Caucasian male appears to be in NAD, Well developed, Well nourished, alert and cooperative Head:  Normocephalic and atraumatic. Eyes:   PEERL, EOMI. No icterus. Conjunctiva pink. Ears:  Normal auditory acuity. Neck:  Supple Throat: Oral cavity and pharynx without inflammation, swelling or lesion.  Respiratory: Respirations even and unlabored. Lungs clear to auscultation bilaterally.   No wheezes, crackles, or rhonchi.  Cardiovascular: Normal S1, S2. No MRG. Regular rate and rhythm. No peripheral edema, cyanosis or pallor.  Gastrointestinal:  Soft, nondistended, nontender. No rebound or guarding. Normal bowel sounds. No appreciable masses or hepatomegaly. Rectal:  Not performed.  Msk:  Symmetrical without gross deformities. Without edema, no deformity or joint abnormality.  Neurologic:  Alert and  oriented x4;  grossly normal  neurologically.  Skin:   Dry and intact without significant lesions or rashes. Psychiatric: Demonstrates good judgement and reason without abnormal affect or behaviors.  RELEVANT LABS AND IMAGING: CBC    Component Value Date/Time   WBC 3.9 (L) 08/28/2019 1312   RBC 4.62 08/28/2019 1312   HGB 14.7 08/28/2019 1312   HCT 41.6 08/28/2019 1312   PLT 70 (L) 08/28/2019 1312   MCV 90.0 08/28/2019 1312   MCH 31.8 08/28/2019 1312   MCHC 35.3 08/28/2019 1312   RDW 12.0 08/28/2019 1312   LYMPHSABS 1.2 08/28/2019 1312   MONOABS 0.4 08/28/2019 1312   EOSABS 0.1 08/28/2019 1312   BASOSABS 0.0 08/28/2019 1312   Assessment: 1.  Cirrhosis: New diagnosis via imaging with portal hypertension and splenomegaly as well as thrombocytopenia,  patient does drink about a case of beer a week over the past 2 to 3 years, family history of alcoholic cirrhosis in his father and grandfather, history of fatty liver noted first in 2012 on ultrasound; consider NAFLD versus alcoholic cirrhosis versus autoimmune versus other 2.  Thrombocytopenia 3.  Portal venous hypertension  Plan: 1.  Ordered labs to include a CBC, CMP, PT/INR, alpha-1 antitrypsin, IgA, TTG, AFP, mitochondrial antibody, ANA, ASMA, ceruloplasmin, hepatitis studies 2.  Scheduled the patient for an EGD for variceal screening/surveillance and a screening colonoscopy in the [place] with Dr. Hilarie Fredrickson.  Did discuss risks, benefits, limitations and alternatives and patient agrees to proceed. 3.  Advised the patient to avoid hepatotoxic substances including alcohol. 4.  Patient to follow in clinic per recommendations after procedures and labs above.  Ellouise Newer, PA-C [facility] 09/17/2019, 3:49 PM  Cc: Shirline Frees, MD

## 2019-09-17 NOTE — Progress Notes (Signed)
Addendum: Reviewed and agree with assessment and management plan. Alcohol abstinence is paramount in the setting of cirrhosis.  He would benefit from relapse prevention counseling including AA if he is open to such.   Laiklynn Raczynski, Lajuan Lines, MD

## 2019-09-17 NOTE — Patient Instructions (Signed)
If you are age 45 or older, your body mass index should be between 23-30. Your Body mass index is 36.02 kg/m. If this is out of the aforementioned range listed, please consider follow up with your Primary Care Provider.  If you are age 68 or younger, your body mass index should be between 19-25. Your Body mass index is 36.02 kg/m. If this is out of the aformentioned range listed, please consider follow up with your Primary Care Provider.   Your provider has requested that you go to the basement level for lab work before leaving today. Press "B" on the elevator. The lab is located at the first door on the left as you exit the elevator.  You have been scheduled for an endoscopy and colonoscopy. Please follow the written instructions given to you at your visit today. Please pick up your prep supplies at the pharmacy within the next 1-3 days. If you use inhalers (even only as needed), please bring them with you on the day of your procedure.  Due to recent changes in healthcare laws, you may see the results of your imaging and laboratory studies on MyChart before your provider has had a chance to review them.  We understand that in some cases there may be results that are confusing or concerning to you. Not all laboratory results come back in the same time frame and the provider may be waiting for multiple results in order to interpret others.  Please give [place] 48 hours in order for your provider to thoroughly review all the results before contacting the office for clarification of your results.

## 2019-09-20 LAB — CERULOPLASMIN: Ceruloplasmin: 28 mg/dL (ref 18–36)

## 2019-09-20 LAB — HEPATITIS A ANTIBODY, TOTAL: Hepatitis A AB,Total: NONREACTIVE

## 2019-09-20 LAB — TISSUE TRANSGLUTAMINASE ABS,IGG,IGA
(tTG) Ab, IgA: 5 U/mL — ABNORMAL HIGH
(tTG) Ab, IgG: 2 U/mL

## 2019-09-20 LAB — HEPATITIS B SURFACE ANTIBODY,QUALITATIVE: Hep B S Ab: NONREACTIVE

## 2019-09-20 LAB — AFP TUMOR MARKER: AFP-Tumor Marker: 3 ng/mL (ref <6.1)

## 2019-09-20 LAB — ANTI-SMOOTH MUSCLE ANTIBODY, IGG: Actin (Smooth Muscle) Antibody (IGG): <20 U (ref <20)

## 2019-09-20 LAB — ALPHA-1-ANTITRYPSIN: A-1 Antitrypsin, Ser: 167 mg/dL (ref 83–199)

## 2019-09-20 LAB — MITOCHONDRIAL ANTIBODIES: Mitochondrial M2 Ab, IgG: <=20 U

## 2019-09-23 LAB — ANTI-SMOOTH MUSCLE AB BY IFA: Anti-Smooth Muscle Ab by IFA: 1:20 {titer} — ABNORMAL HIGH

## 2019-09-26 ENCOUNTER — Other Ambulatory Visit: Payer: Self-pay

## 2019-09-26 DIAGNOSIS — K7460 Unspecified cirrhosis of liver: Secondary | ICD-10-CM

## 2019-10-03 ENCOUNTER — Other Ambulatory Visit: Payer: BC Managed Care – PPO

## 2019-10-03 DIAGNOSIS — K7460 Unspecified cirrhosis of liver: Secondary | ICD-10-CM | POA: Diagnosis not present

## 2019-10-05 LAB — IGG: IgG (Immunoglobin G), Serum: 1201 mg/dL (ref 600–1640)

## 2019-10-05 LAB — ANTI-NUCLEAR AB-TITER (ANA TITER): ANA Titer 1: 1:40 {titer} — ABNORMAL HIGH

## 2019-10-05 LAB — ANA: Anti Nuclear Antibody (ANA): POSITIVE — AB

## 2019-11-05 ENCOUNTER — Encounter: Payer: Self-pay | Admitting: *Deleted

## 2019-11-09 ENCOUNTER — Other Ambulatory Visit: Payer: Self-pay

## 2019-11-09 ENCOUNTER — Ambulatory Visit (AMBULATORY_SURGERY_CENTER): Payer: BC Managed Care – PPO | Admitting: Internal Medicine

## 2019-11-09 ENCOUNTER — Encounter: Payer: Self-pay | Admitting: Internal Medicine

## 2019-11-09 VITALS — BP 144/89 | HR 74 | Temp 98.3°F | Resp 26

## 2019-11-09 DIAGNOSIS — K7469 Other cirrhosis of liver: Secondary | ICD-10-CM

## 2019-11-09 DIAGNOSIS — K766 Portal hypertension: Secondary | ICD-10-CM | POA: Diagnosis not present

## 2019-11-09 DIAGNOSIS — D128 Benign neoplasm of rectum: Secondary | ICD-10-CM

## 2019-11-09 DIAGNOSIS — D123 Benign neoplasm of transverse colon: Secondary | ICD-10-CM | POA: Diagnosis not present

## 2019-11-09 DIAGNOSIS — K635 Polyp of colon: Secondary | ICD-10-CM

## 2019-11-09 DIAGNOSIS — K319 Disease of stomach and duodenum, unspecified: Secondary | ICD-10-CM | POA: Diagnosis not present

## 2019-11-09 DIAGNOSIS — K2980 Duodenitis without bleeding: Secondary | ICD-10-CM

## 2019-11-09 DIAGNOSIS — K631 Perforation of intestine (nontraumatic): Secondary | ICD-10-CM | POA: Diagnosis not present

## 2019-11-09 DIAGNOSIS — K259 Gastric ulcer, unspecified as acute or chronic, without hemorrhage or perforation: Secondary | ICD-10-CM | POA: Diagnosis not present

## 2019-11-09 DIAGNOSIS — Z1211 Encounter for screening for malignant neoplasm of colon: Secondary | ICD-10-CM

## 2019-11-09 DIAGNOSIS — R768 Other specified abnormal immunological findings in serum: Secondary | ICD-10-CM

## 2019-11-09 DIAGNOSIS — K2970 Gastritis, unspecified, without bleeding: Secondary | ICD-10-CM

## 2019-11-09 MED ORDER — PANTOPRAZOLE SODIUM 40 MG PO TBEC: 40.0000 mg | DELAYED_RELEASE_TABLET | Freq: Every day | ORAL | 11 refills | Status: DC

## 2019-11-09 MED ORDER — SODIUM CHLORIDE 0.9 % IV SOLN: 500.0000 mL | Freq: Once | INTRAVENOUS | Status: DC

## 2019-11-09 NOTE — Op Note (Signed)
[facility] Patient Name: Eulis Salazar Procedure Date: 11/09/2019 1:29 PM MRN: [id number] Endoscopist: Jerene Bears , MD Age: 44 Referring MD:  Date of Birth: 1975-02-02 Gender: Male Account #: [id number] Procedure:                Upper GI endoscopy Indications:              Cirrhosis rule out esophageal varices, portal                            hypertension, mildly elevated TTG IgA Medicines:                Monitored Anesthesia Care Procedure:                Pre-Anesthesia Assessment:                           - Prior to the procedure, a History and Physical                            was performed, and patient medications and                            allergies were reviewed. The patient's tolerance of                            previous anesthesia was also reviewed. The risks                            and benefits of the procedure and the sedation                            options and risks were discussed with the patient.                            All questions were answered, and informed consent                            was obtained. Prior Anticoagulants: The patient has                            taken no previous anticoagulant or antiplatelet                            agents. ASA Grade Assessment: III - A patient with                            severe systemic disease. After reviewing the risks                            and benefits, the patient was deemed in                            satisfactory condition to undergo the procedure.  After obtaining informed consent, the endoscope was                            passed under direct vision. Throughout the                            procedure, the patient's blood pressure, pulse, and                            oxygen saturations were monitored continuously. The                            Endoscope was introduced through the mouth, and                            advanced to the second  part of duodenum. The upper                            GI endoscopy was accomplished without difficulty.                            The patient tolerated the procedure well. Scope In: Scope Out: Findings:                 LA Grade A (one or more mucosal breaks less than 5                            mm, not extending between tops of 2 mucosal folds)                            esophagitis was found at the gastroesophageal                            junction.                           There is no endoscopic evidence of varices in the                            entire esophagus.                           Moderate portal hypertensive gastropathy was found                            in the cardia and in the gastric fundus.                           Severe inflammation characterized by adherent                            blood, congestion (edema), erosions, friability and                            granularity was found in the  gastric body and in                            the gastric antrum. Biopsies were taken with a cold                            forceps for histology and Helicobacter pylori                            testing.                           There is no endoscopic evidence of varices in the                            cardia and in the gastric fundus.                           Patchy mild inflammation characterized by erosions                            and erythema was found in the duodenal bulb and in                            the second portion of the duodenum. Biopsies were                            taken with a cold forceps for histology. Complications:            No immediate complications. Estimated Blood Loss:     Estimated blood loss was minimal. Impression:               - LA Grade A reflux esophagitis.                           - Portal hypertensive gastropathy.                           - Gastritis. Biopsied.                           - Duodenitis. Biopsied.                            - No evidence of esophageal or gastric varices. Recommendation:           - Patient has a contact number available for                            emergencies. The signs and symptoms of potential                            delayed complications were discussed with the                            patient. Return to normal activities tomorrow.  Written discharge instructions were provided to the                            patient.                           - Resume previous diet.                           - Continue present medications.                           - Await pathology results.                           - Begin pantoprazole 40 mg once daily.                           - Repeat upper endoscopy in 2 years for screening                            purposes.                           - See the other procedure note for documentation of                            additional recommendations. Jerene Bears, MD 11/09/2019 2:17:31 PM This report has been signed electronically.

## 2019-11-09 NOTE — Progress Notes (Signed)
Pt's states no medical or surgical changes since previsit or office visit. 

## 2019-11-09 NOTE — Progress Notes (Signed)
Called to room to assist during endoscopic procedure.  Patient ID and intended procedure confirmed with present staff. Received instructions for my participation in the procedure from the performing physician.  

## 2019-11-09 NOTE — Patient Instructions (Signed)
Discharge instructions given. Handouts on polyps,hemorrhjoids,Esophagitis and Gastritis. Prescription sent to pharmacy. Resume previous medications. YOU HAD AN ENDOSCOPIC PROCEDURE TODAY AT [facility]:   Refer to the procedure report that was given to you for any specific questions about what was found during the examination.  If the procedure report does not answer your questions, please call your gastroenterologist to clarify.  If you requested that your care partner not be given the details of your procedure findings, then the procedure report has been included in a sealed envelope for you to review at your convenience later.  YOU SHOULD EXPECT: Some feelings of bloating in the abdomen. Passage of more gas than usual.  Walking can help get rid of the air that was put into your GI tract during the procedure and reduce the bloating. If you had a lower endoscopy (such as a colonoscopy or flexible sigmoidoscopy) you may notice spotting of blood in your stool or on the toilet paper. If you underwent a bowel prep for your procedure, you may not have a normal bowel movement for a few days.  Please Note:  You might notice some irritation and congestion in your nose or some drainage.  This is from the oxygen used during your procedure.  There is no need for concern and it should clear up in a day or so.  SYMPTOMS TO REPORT IMMEDIATELY:   Following lower endoscopy (colonoscopy or flexible sigmoidoscopy):  Excessive amounts of blood in the stool  Significant tenderness or worsening of abdominal pains  Swelling of the abdomen that is new, acute  Fever of 100F or higher   Following upper endoscopy (EGD)  Vomiting of blood or coffee ground material  New chest pain or pain under the shoulder blades  Painful or persistently difficult swallowing  New shortness of breath  Fever of 100F or higher  Black, tarry-looking stools  For urgent or emergent issues, a gastroenterologist can be  reached at any hour by calling [phone]. Do not use MyChart messaging for urgent concerns.    DIET:  We do recommend a small meal at first, but then you may proceed to your regular diet.  Drink plenty of fluids but you should avoid alcoholic beverages for 24 hours.  ACTIVITY:  You should plan to take it easy for the rest of today and you should NOT DRIVE or use heavy machinery until tomorrow (because of the sedation medicines used during the test).    FOLLOW UP: Our staff will call the number listed on your records 48-72 hours following your procedure to check on you and address any questions or concerns that you may have regarding the information given to you following your procedure. If we do not reach you, we will leave a message.  We will attempt to reach you two times.  During this call, we will ask if you have developed any symptoms of COVID 19. If you develop any symptoms (ie: fever, flu-like symptoms, shortness of breath, cough etc.) before then, please call [phone].  If you test positive for Covid 19 in the 2 weeks post procedure, please call and report this information to [place].    If any biopsies were taken you will be contacted by phone or by letter within the next 1-3 weeks.  Please call us at [phone] if you have not heard about the biopsies in 3 weeks.    SIGNATURES/CONFIDENTIALITY: You and/or your care partner have signed paperwork which will be entered into your electronic  medical record.  These signatures attest to the fact that that the information above on your After Visit Summary has been reviewed and is understood.  Full responsibility of the confidentiality of this discharge information lies with you and/or your care-partner.

## 2019-11-09 NOTE — Op Note (Signed)
[facility] Patient Name: Caleb Baker Procedure Date: 11/09/2019 1:28 PM MRN: [id number] Endoscopist: Jerene Bears , MD Age: 44 Referring MD:  Date of Birth: [birth year] Gender: Male Account #: [id number] Procedure:                Colonoscopy Indications:              Screening for colorectal malignant neoplasm, This                            is the patient's first colonoscopy Medicines:                Monitored Anesthesia Care Procedure:                Pre-Anesthesia Assessment:                           - Prior to the procedure, a History and Physical                            was performed, and patient medications and                            allergies were reviewed. The patient's tolerance of                            previous anesthesia was also reviewed. The risks                            and benefits of the procedure and the sedation                            options and risks were discussed with the patient.                            All questions were answered, and informed consent                            was obtained. Prior Anticoagulants: The patient has                            taken no previous anticoagulant or antiplatelet                            agents. ASA Grade Assessment: III - A patient with                            severe systemic disease. After reviewing the risks                            and benefits, the patient was deemed in                            satisfactory condition to undergo the procedure.  After obtaining informed consent, the colonoscope                            was passed under direct vision. Throughout the                            procedure, the patient's blood pressure, pulse, and                            oxygen saturations were monitored continuously. The                            Colonoscope was introduced through the anus and                            advanced to the cecum,  identified by appendiceal                            orifice and ileocecal valve. The colonoscopy was                            performed without difficulty. The patient tolerated                            the procedure well. The quality of the bowel                            preparation was good. The ileocecal valve,                            appendiceal orifice, and rectum were photographed. Scope In: 1:52:35 PM Scope Out: 2:06:20 PM Scope Withdrawal Time: 0 hours 10 minutes 12 seconds  Total Procedure Duration: 0 hours 13 minutes 45 seconds  Findings:                 The digital rectal exam was normal.                           Two sessile polyps were found in the rectum and                            transverse colon. The polyps were 4 to 6 mm in                            size. These polyps were removed with a cold snare.                            Resection and retrieval were complete.                           Internal hemorrhoids were found during                            retroflexion. The hemorrhoids were small. Complications:  No immediate complications. Estimated Blood Loss:     Estimated blood loss was minimal. Impression:               - Two 4 to 6 mm polyps in the rectum and in the                            transverse colon, removed with a cold snare.                            Resected and retrieved.                           - Internal hemorrhoids. Recommendation:           - Patient has a contact number available for                            emergencies. The signs and symptoms of potential                            delayed complications were discussed with the                            patient. Return to normal activities tomorrow.                            Written discharge instructions were provided to the                            patient.                           - Resume previous diet.                           - Continue present  medications.                           - Await pathology results.                           - Repeat colonoscopy is recommended. The                            colonoscopy date will be determined after pathology                            results from today's exam become available for                            review. Jerene Bears, MD 11/09/2019 2:20:09 PM This report has been signed electronically.

## 2019-11-09 NOTE — Progress Notes (Signed)
Report to PACU, RN, vss, BBS= Clear.  

## 2019-11-13 ENCOUNTER — Telehealth: Payer: Self-pay

## 2019-11-13 NOTE — Telephone Encounter (Signed)
  Follow up Call-  Call back number 11/09/2019  Post procedure Call Back phone  # [phone]  Permission to leave phone message Yes  Some recent data might be hidden     Patient questions:  Do you have a fever, pain , or abdominal swelling? No. Pain Score  0 *  Have you tolerated food without any problems? Yes.    Have you been able to return to your normal activities? Yes.    Do you have any questions about your discharge instructions: Diet   No. Medications  No. Follow up visit  No.  Do you have questions or concerns about your Care? No.  Actions: * If pain score is 4 or above: No action needed, pain <4.  1. Have you developed a fever since your procedure? no  2.   Have you had an respiratory symptoms (SOB or cough) since your procedure? no  3.   Have you tested positive for COVID 19 since your procedure no  4.   Have you had any family members/close contacts diagnosed with the COVID 19 since your procedure?  no   If yes to any of these questions please route to Joylene John, RN and Joella Prince, RN

## 2019-11-13 NOTE — Telephone Encounter (Signed)
First post procedure follow up call, no answer 

## 2019-11-20 ENCOUNTER — Encounter: Payer: Self-pay | Admitting: Internal Medicine

## 2019-11-27 DIAGNOSIS — I10 Essential (primary) hypertension: Secondary | ICD-10-CM | POA: Diagnosis not present

## 2019-11-27 DIAGNOSIS — E119 Type 2 diabetes mellitus without complications: Secondary | ICD-10-CM | POA: Diagnosis not present

## 2019-11-27 DIAGNOSIS — N5201 Erectile dysfunction due to arterial insufficiency: Secondary | ICD-10-CM | POA: Diagnosis not present

## 2019-11-27 DIAGNOSIS — Z125 Encounter for screening for malignant neoplasm of prostate: Secondary | ICD-10-CM | POA: Diagnosis not present

## 2019-11-27 DIAGNOSIS — K760 Fatty (change of) liver, not elsewhere classified: Secondary | ICD-10-CM | POA: Diagnosis not present

## 2019-11-29 ENCOUNTER — Encounter: Payer: Self-pay | Admitting: Internal Medicine

## 2019-11-29 ENCOUNTER — Other Ambulatory Visit (INDEPENDENT_AMBULATORY_CARE_PROVIDER_SITE_OTHER): Payer: BC Managed Care – PPO

## 2019-11-29 ENCOUNTER — Telehealth: Payer: Self-pay | Admitting: *Deleted

## 2019-11-29 ENCOUNTER — Ambulatory Visit (INDEPENDENT_AMBULATORY_CARE_PROVIDER_SITE_OTHER): Payer: BC Managed Care – PPO | Admitting: Internal Medicine

## 2019-11-29 ENCOUNTER — Other Ambulatory Visit: Payer: Self-pay | Admitting: *Deleted

## 2019-11-29 VITALS — BP 120/74 | HR 93 | Ht 73.0 in | Wt 268.6 lb

## 2019-11-29 DIAGNOSIS — K766 Portal hypertension: Secondary | ICD-10-CM | POA: Diagnosis not present

## 2019-11-29 DIAGNOSIS — K7460 Unspecified cirrhosis of liver: Secondary | ICD-10-CM

## 2019-11-29 DIAGNOSIS — R768 Other specified abnormal immunological findings in serum: Secondary | ICD-10-CM

## 2019-11-29 DIAGNOSIS — K3189 Other diseases of stomach and duodenum: Secondary | ICD-10-CM

## 2019-11-29 LAB — PROTIME-INR
INR: 1.2 ratio — ABNORMAL HIGH (ref 0.8–1.0)
Prothrombin Time: 13.4 s — ABNORMAL HIGH (ref 9.6–13.1)

## 2019-11-29 LAB — COMPREHENSIVE METABOLIC PANEL
ALT: 28 U/L (ref 0–53)
AST: 28 U/L (ref 0–37)
Albumin: 4.2 g/dL (ref 3.5–5.2)
Alkaline Phosphatase: 170 U/L — ABNORMAL HIGH (ref 39–117)
BUN: 10 mg/dL (ref 6–23)
CO2: 28 mEq/L (ref 19–32)
Calcium: 9.1 mg/dL (ref 8.4–10.5)
Chloride: 101 mEq/L (ref 96–112)
Creatinine, Ser: 0.97 mg/dL (ref 0.40–1.50)
GFR: 94.44 mL/min (ref >60.00)
Glucose, Bld: 170 mg/dL — ABNORMAL HIGH (ref 70–99)
Potassium: 4.2 mEq/L (ref 3.5–5.1)
Sodium: 137 mEq/L (ref 135–145)
Total Bilirubin: 2.9 mg/dL — ABNORMAL HIGH (ref 0.2–1.2)
Total Protein: 7.1 g/dL (ref 6.0–8.3)

## 2019-11-29 LAB — CBC WITH DIFFERENTIAL/PLATELET
Basophils Absolute: 0 10*3/uL (ref 0.0–0.1)
Basophils Relative: 0.9 % (ref 0.0–3.0)
Eosinophils Absolute: 0.2 10*3/uL (ref 0.0–0.7)
Eosinophils Relative: 4.5 % (ref 0.0–5.0)
HCT: 43.4 % (ref 39.0–52.0)
Hemoglobin: 15.4 g/dL (ref 13.0–17.0)
Lymphocytes Relative: 27.7 % (ref 12.0–46.0)
Lymphs Abs: 1 10*3/uL (ref 0.7–4.0)
MCHC: 35.4 g/dL (ref 30.0–36.0)
MCV: 90.3 fl (ref 78.0–100.0)
Monocytes Absolute: 0.3 10*3/uL (ref 0.1–1.0)
Monocytes Relative: 7.2 % (ref 3.0–12.0)
Neutro Abs: 2.2 10*3/uL (ref 1.4–7.7)
Neutrophils Relative %: 59.7 % (ref 43.0–77.0)
Platelets: 72 10*3/uL — ABNORMAL LOW (ref 150.0–400.0)
RBC: 4.81 Mil/uL (ref 4.22–5.81)
RDW: 13.5 % (ref 11.5–15.5)
WBC: 3.7 10*3/uL — ABNORMAL LOW (ref 4.0–10.5)

## 2019-11-29 NOTE — Patient Instructions (Addendum)
Your provider has requested that you go to the basement level for lab work before leaving today. Press "B" on the elevator. The lab is located at the first door on the left as you exit the elevator.  Please continue pantoprazole 40 mg daily.  You will be due for MRI of your abdomen with/without in July 2022. We will contact you with an appointment when it gets closer to that time.  We will contact you regarding liver biopsy after Dr Hilarie Fredrickson speaks to the radiology regarding which approach would be best in doing the biopsy (transjugular vs percutaneous).  Please follow up with Dr Hilarie Fredrickson in the office in 6 months.  If you are age 14 or older, your body mass index should be between 23-30. Your Body mass index is 35.44 kg/m. If this is out of the aforementioned range listed, please consider follow up with your Primary Care Provider.  If you are age 25 or younger, your body mass index should be between 19-25. Your Body mass index is 35.44 kg/m. If this is out of the aformentioned range listed, please consider follow up with your Primary Care Provider.   Due to recent changes in healthcare laws, you may see the results of your imaging and laboratory studies on MyChart before your provider has had a chance to review them.  We understand that in some cases there may be results that are confusing or concerning to you. Not all laboratory results come back in the same time frame and the provider may be waiting for multiple results in order to interpret others.  Please give [place] 48 hours in order for your provider to thoroughly review all the results before contacting the office for clarification of your results.

## 2019-11-29 NOTE — Progress Notes (Addendum)
Subjective:    Patient ID: Caleb Baker, male    DOB: 02/25/1975, 44 y.o.   MRN: [id number]  HPI Yoandri Congrove is a 45 year old male with a history of cirrhosis with portal hypertension, sessile serrated colon polyp, family history of cirrhosis who is here for follow-up.  He is here today with his wife.  I last saw him for upper endoscopy and colonoscopy which were performed on 11/09/2019.  EGD revealed no esophageal varices.  Mild esophagitis.  Moderate portal gastropathy.  Antral gastritis.  Gastric biopsies negative for H. pylori.  Duodenal biopsies showed peptic duodenitis. Revealed 2 polyps 1 of which was a sessile serrated polyp, the other hyperplastic.    He reports he is feeling well currently.  No specific GI complaints.  No abd or leg swelling.  No bleeding including blood in stool or melena.  No jaundice.  No abd pain.  Good appetite.  No heartburn.  No dysphagia.  No n/v.  BMs formed and regular.    He has remained completely alcohol abstinent since the last week in May 2021 when he was told of his liver cirrhosis.  Family history is notable for his father and grandfather who had liver cirrhosis but secondary to alcohol abuse.  His father died of complications of liver cirrhosis.  His sister has autoimmune conditions.  Review of Systems As per HPI, otherwise negative  Current Medications, Allergies, Past Medical History, Past Surgical History, Family History and Social History were reviewed in [employer] record.     Objective:   Physical Exam BP 120/74   Pulse 93   Ht 6\' 1"  (1.854 m)   Wt 268 lb 9.6 oz (121.8 kg)   SpO2 99%   BMI 35.44 kg/m  Gen: awake, alert, NAD HEENT: anicteric, op clear CV: RRR, no mrg Pulm: CTA b/l Abd: soft, NT/ND, +BS throughout Ext: no c/c/e Neuro: nonfocal, no asterixis  CBC    Component Value Date/Time   WBC 3.7 (L) 11/29/2019 0926   RBC 4.81 11/29/2019 0926   HGB 15.4 11/29/2019 0926   HGB 14.7 08/28/2019  1312   HCT 43.4 11/29/2019 0926   PLT 72.0 (L) 11/29/2019 0926   PLT 70 (L) 08/28/2019 1312   MCV 90.3 11/29/2019 0926   MCH 31.8 08/28/2019 1312   MCHC 35.4 11/29/2019 0926   RDW 13.5 11/29/2019 0926   LYMPHSABS 1.0 11/29/2019 0926   MONOABS 0.3 11/29/2019 0926   EOSABS 0.2 11/29/2019 0926   BASOSABS 0.0 11/29/2019 0926   CMP     Component Value Date/Time   NA 137 11/29/2019 0926   K 4.2 11/29/2019 0926   CL 101 11/29/2019 0926   CO2 28 11/29/2019 0926   GLUCOSE 170 (H) 11/29/2019 0926   BUN 10 11/29/2019 0926   CREATININE 0.97 11/29/2019 0926   CALCIUM 9.1 11/29/2019 0926   PROT 7.1 11/29/2019 0926   ALBUMIN 4.2 11/29/2019 0926   AST 28 11/29/2019 0926   ALT 28 11/29/2019 0926   ALKPHOS 170 (H) 11/29/2019 0926   BILITOT 2.9 (H) 11/29/2019 0926   ANA + 1:40 Anti-smooth muscle +1: 20 TTG iga elevated at 5  MRI ABDOMEN WITHOUT AND WITH CONTRAST   TECHNIQUE: Multiplanar multisequence MR imaging of the abdomen was performed both before and after the administration of intravenous contrast.   CONTRAST:  67mL MULTIHANCE GADOBENATE DIMEGLUMINE 529 MG/ML IV SOLN   COMPARISON:  Abdomen ultrasound on 07/06/2019   FINDINGS: Lower Chest: No acute findings.  Hepatobiliary: Caudate and left lobe hypertrophy and mild capsular nodularity are consistent with hepatic cirrhosis. Recanalization paraumbilical veins is consistent portal venous hypertension. No hepatic masses are identified. Gallbladder is unremarkable. No evidence of biliary ductal dilatation.   Pancreas:  No mass or inflammatory changes.   Spleen: Moderate to marked splenomegaly with length measuring approximately 20 cm. No splenic masses identified.   Adrenals/Urinary Tract: Normal adrenal glands. A few small right renal cysts are noted, however there is no evidence of renal masses or hydronephrosis.   Stomach/Bowel: No evidence of obstruction, inflammatory process or abnormal fluid collections.     Vascular/Lymphatic: No pathologically enlarged lymph nodes. Portosystemic collaterals are seen in the gastrohepatic and gastrosplenic ligaments, with esophageal varices. These findings are consistent with portal venous hypertension. No abdominal aortic aneurysm.   Other:  No evidence of ascites.   Musculoskeletal:  No suspicious bone lesions identified.   IMPRESSION: Hepatic cirrhosis. No evidence of hepatic neoplasm or other acute findings.   Small renal cysts.  No evidence of renal neoplasm or hydronephrosis.   Findings of portal venous hypertension including splenomegaly and esophageal varices. No evidence of ascites.     Electronically Signed   By: Marlaine Hind M.D.   On: 07/30/2019 18:40       Assessment & Plan:  44 year old male with a history of cirrhosis with portal hypertension, sessile serrated colon polyp, family history of cirrhosis who is here for follow-up.   1.  Liver cirrhosis with portal hypertension --his disease remains well compensated.  His only portal hypertension manifestation currently is portal gastropathy.  He does have intra-abdominal varices but no evidence of endoscopic varices after recent EGD.  We discussed his liver disease at length today.  He does have an alcohol history but other confounders including positive ANA and anti-smooth muscle antibody.  We discussed this at length today and I feel it pertinent to rule out autoimmune hepatitis.  The best way to do so is by liver biopsy. --Schedule ultrasound-guided liver biopsy with radiology --Repeat CBC, CMP and INR today.  Repeat IgG and anti-smooth muscle antibody as this previously was negative on one occasion and positive only other --No evidence of hepatic encephalopathy or ascites --He is up-to-date with [place] screening by MRI in July 2021; repeat MRI abdomen with and without contrast hepatic protocol in July 2022 --69-month follow-up with me --continue strict alcohol abstinence --I do not think  we need to consider liver transplant evaluation at this time --Variceal screening up-to-date, repeat EGD for screening in October 2023  2.  Positive tissue transglutaminase antibody --peptic duodenitis by biopsy.  I spoke with Dr. Tresa Moore with pathology today and she will reexamine the duodenal biopsies for any evidence of celiac disease.  He is not having abdominal pain, diarrhea or small bowel symptoms currently.  3.  Portal gastropathy --continue pantoprazole 40 mg daily  4.  Sessile serrated polyp --surveillance colonoscopy in 5 years, October 2026  Addendum Pathology relooked at duodenal biopsy and NOT consistent with celiac disease.  Thus GFD not needed.  He is also to be scheduled for transcutaneous liver biopsy with radiology, r/o AIH.

## 2019-11-29 NOTE — Telephone Encounter (Signed)
I have placed orders for liver biopsy @ [facility] radiology. They will contact patient once radiologist has reviewed the case.

## 2019-11-29 NOTE — Telephone Encounter (Signed)
-----   Message from Jerene Bears, MD sent at 11/29/2019  1:24 PM EST ----- Carla Drape, please arrange ultrasound-guided liver biopsy with radiology.This can be transcutaneousRule out autoimmune hepatitisThanksJMP

## 2019-12-02 LAB — ANTI-SMOOTH MUSCLE ANTIBODY, IGG: Actin (Smooth Muscle) Antibody (IGG): <20 U (ref <20)

## 2019-12-02 LAB — IGG: IgG (Immunoglobin G), Serum: 1276 mg/dL (ref 600–1640)

## 2019-12-10 ENCOUNTER — Other Ambulatory Visit: Payer: Self-pay | Admitting: Radiology

## 2019-12-11 ENCOUNTER — Encounter (HOSPITAL_COMMUNITY): Payer: Self-pay

## 2019-12-11 ENCOUNTER — Ambulatory Visit (HOSPITAL_COMMUNITY)
Admission: RE | Admit: 2019-12-11 | Discharge: 2019-12-11 | Disposition: A | Discharge status: Home / Self Care | Payer: BC Managed Care – PPO | Source: Ambulatory Visit | Attending: Internal Medicine | Admitting: Internal Medicine

## 2019-12-11 ENCOUNTER — Other Ambulatory Visit: Payer: Self-pay

## 2019-12-11 DIAGNOSIS — K7581 Nonalcoholic steatohepatitis (NASH): Secondary | ICD-10-CM | POA: Insufficient documentation

## 2019-12-11 DIAGNOSIS — R161 Splenomegaly, not elsewhere classified: Secondary | ICD-10-CM | POA: Insufficient documentation

## 2019-12-11 DIAGNOSIS — Z79899 Other long term (current) drug therapy: Secondary | ICD-10-CM | POA: Insufficient documentation

## 2019-12-11 DIAGNOSIS — Z7984 Long term (current) use of oral hypoglycemic drugs: Secondary | ICD-10-CM | POA: Insufficient documentation

## 2019-12-11 DIAGNOSIS — K766 Portal hypertension: Secondary | ICD-10-CM | POA: Diagnosis not present

## 2019-12-11 DIAGNOSIS — Z888 Allergy status to other drugs, medicaments and biological substances status: Secondary | ICD-10-CM | POA: Insufficient documentation

## 2019-12-11 DIAGNOSIS — K7460 Unspecified cirrhosis of liver: Secondary | ICD-10-CM | POA: Insufficient documentation

## 2019-12-11 DIAGNOSIS — E119 Type 2 diabetes mellitus without complications: Secondary | ICD-10-CM | POA: Diagnosis not present

## 2019-12-11 DIAGNOSIS — Z8379 Family history of other diseases of the digestive system: Secondary | ICD-10-CM | POA: Diagnosis not present

## 2019-12-11 DIAGNOSIS — D696 Thrombocytopenia, unspecified: Secondary | ICD-10-CM | POA: Diagnosis not present

## 2019-12-11 DIAGNOSIS — K7030 Alcoholic cirrhosis of liver without ascites: Secondary | ICD-10-CM | POA: Diagnosis not present

## 2019-12-11 LAB — CBC
HCT: 44.2 % (ref 39.0–52.0)
Hemoglobin: 15.5 g/dL (ref 13.0–17.0)
MCH: 32.1 pg (ref 26.0–34.0)
MCHC: 35.1 g/dL (ref 30.0–36.0)
MCV: 91.5 fL (ref 80.0–100.0)
Platelets: 73 10*3/uL — ABNORMAL LOW (ref 150–400)
RBC: 4.83 MIL/uL (ref 4.22–5.81)
RDW: 13 % (ref 11.5–15.5)
WBC: 3.7 10*3/uL — ABNORMAL LOW (ref 4.0–10.5)
nRBC: 0 % (ref 0.0–0.2)

## 2019-12-11 LAB — PROTIME-INR
INR: 1.2 (ref 0.8–1.2)
Prothrombin Time: 14.7 seconds (ref 11.4–15.2)

## 2019-12-11 LAB — GLUCOSE, CAPILLARY: Glucose-Capillary: 145 mg/dL — ABNORMAL HIGH (ref 70–99)

## 2019-12-11 MED ORDER — MIDAZOLAM HCL 2 MG/2ML IJ SOLN
INTRAMUSCULAR | Status: AC
  Filled 2019-12-11: qty 2

## 2019-12-11 MED ORDER — SODIUM CHLORIDE 0.9 % IV SOLN: INTRAVENOUS | Status: DC

## 2019-12-11 MED ORDER — SODIUM CHLORIDE 0.9 % IV SOLN
INTRAVENOUS | Status: AC | PRN
  Administered 2019-12-11: 10 mL/h via INTRAVENOUS

## 2019-12-11 MED ORDER — FENTANYL CITRATE (PF) 100 MCG/2ML IJ SOLN
INTRAMUSCULAR | Status: AC | PRN
Start: 2019-12-11 — End: 2019-12-11
  Administered 2019-12-11: 50 ug via INTRAVENOUS
  Administered 2019-12-11 (×2): 25 ug via INTRAVENOUS

## 2019-12-11 MED ORDER — MIDAZOLAM HCL 2 MG/2ML IJ SOLN
INTRAMUSCULAR | Status: AC | PRN
  Administered 2019-12-11 (×2): 0.5 mg via INTRAVENOUS
  Administered 2019-12-11: 1 mg via INTRAVENOUS

## 2019-12-11 MED ORDER — FENTANYL CITRATE (PF) 100 MCG/2ML IJ SOLN
INTRAMUSCULAR | Status: AC
  Filled 2019-12-11: qty 2

## 2019-12-11 MED ORDER — LIDOCAINE HCL (PF) 1 % IJ SOLN
INTRAMUSCULAR | Status: AC
  Filled 2019-12-11: qty 30

## 2019-12-11 MED ORDER — GELATIN ABSORBABLE 12-7 MM EX MISC
CUTANEOUS | Status: AC
  Filled 2019-12-11: qty 1

## 2019-12-11 NOTE — Procedures (Signed)
Interventional Radiology Procedure:   Indications: Thrombocytopenia with splenomegaly and cirrhosis  Procedure: US guided liver biopsy  Findings: 3 cores from right hepatic lobe  Complications: None     EBL: less than 10 ml  Plan: Bedrest 3 hours   Caleb Buendia R. Anselm Pancoast, MD  Pager: [phone]

## 2019-12-11 NOTE — Progress Notes (Signed)
Discharge instructions reviewed with pt and his wife (via telephone) both voice understanding.  

## 2019-12-11 NOTE — H&P (Signed)
Chief Complaint: Evaluation for autoimmune cirrhosis. Request is for liver biopsy.   Referring Physician(s): Pyrtle,Jay M  Supervising Physician: Markus Daft  Patient Status: [facility] - Out-pt  History of Present Illness: Caleb Baker is a 44 y.o. male History of DM, cirrhosis with portal hypertension and gastropathy. [place] from 6.18.21 Found to have indeterminate liver lesions while being worked up for thrombocytopenia and splenomegaly. MRI performed for further evaluation on 7.12.21 reads Hepatic cirrhosis. No evidence of hepatic neoplasm or other acute findings Team is requesting a liver biopsy for further determination of possible autoimmune hepatitis.   Patient reports his cirrhosis was found during routine lab work and denies any abdominal pain, nausea and vomiting.  Return precautions and treatment recommendations and follow-up discussed with the patient who is agreeable with the plan.   Past Medical History:  Diagnosis Date  . Asthma   . Cirrhosis ([place])   . Diabetes ([place])   . Hypertension   . Portal venous hypertension ([facility])   . Renal cyst   . Thrombocytopenia ([facility])     Past Surgical History:  Procedure Laterality Date  . HERNIA REPAIR    . WISDOM TOOTH EXTRACTION      Allergies: Prednisone  Medications: Prior to Admission medications   Medication Sig Start Date End Date Taking? Authorizing Provider  lisinopril (PRINIVIL,ZESTRIL) 10 MG tablet Take 10 mg by mouth daily.   Yes [provider]  metFORMIN (GLUCOPHAGE-XR) 500 MG 24 hr tablet Take 500 mg by mouth daily with breakfast.   Yes [provider]  pantoprazole (PROTONIX) 40 MG tablet Take 1 tablet (40 mg total) by mouth daily. 11/09/19  Yes Pyrtle, Lajuan Lines, MD  albuterol (VENTOLIN HFA) 108 (90 Base) MCG/ACT inhaler Inhale 2 puffs into the lungs every 6 (six) hours as needed for wheezing or shortness of breath.    [provider]  naproxen sodium (ALEVE) 220 MG tablet Take 220 mg by mouth  daily as needed (pain).    [provider]     Family History  Problem Relation Age of Onset  . Liver disease Father   . Liver disease Paternal Grandmother   . Colon cancer Neg Hx   . Stomach cancer Neg Hx   . Pancreatic cancer Neg Hx   . Esophageal cancer Neg Hx     Social History   Socioeconomic History  . Marital status: Married    Spouse name: Not on file  . Number of children: Not on file  . Years of education: Not on file  . Highest education level: Not on file  Occupational History  . Not on file  Tobacco Use  . Smoking status: Never Smoker  . Smokeless tobacco: Former Systems developer    Types: Snuff  Vaping Use  . Vaping Use: Never used  Substance and Sexual Activity  . Alcohol use: Not Currently    Comment: social; hasn't had a drink since May 2021  . Drug use: Never  . Sexual activity: Not Currently  Other Topics Concern  . Not on file  Social History Narrative  . Not on file   Social Determinants of Health   Financial Resource Strain:   . Difficulty of Paying Living Expenses: Not on file  Food Insecurity:   . Worried About Charity fundraiser in the Last Year: Not on file  . Ran Out of Food in the Last Year: Not on file  Transportation Needs:   . Lack of Transportation (Medical): Not on file  .  Lack of Transportation (Non-Medical): Not on file  Physical Activity:   . Days of Exercise per Week: Not on file  . Minutes of Exercise per Session: Not on file  Stress:   . Feeling of Stress : Not on file  Social Connections:   . Frequency of Communication with Friends and Family: Not on file  . Frequency of Social Gatherings with Friends and Family: Not on file  . Attends Religious Services: Not on file  . Active Member of Clubs or Organizations: Not on file  . Attends Archivist Meetings: Not on file  . Marital Status: Not on file     Review of Systems: A 12 point ROS discussed and pertinent positives are indicated in the HPI above.  All  other systems are negative.  Review of Systems  Constitutional: Negative for fever.  HENT: Negative for congestion.   Respiratory: Negative for cough and shortness of breath.   Cardiovascular: Negative for chest pain.  Gastrointestinal: Negative for abdominal pain.  Neurological: Negative for headaches.  Psychiatric/Behavioral: Negative for behavioral problems and confusion.    Baker Signs: BP 121/81   Pulse 88   Temp 98.2 F (36.8 C) (Oral)   Resp 16   Ht 6\' 1"  (1.854 m)   Wt 265 lb (120.2 kg)   SpO2 100%   BMI 34.96 kg/m   Physical Exam Vitals and nursing note reviewed.  Constitutional:      Appearance: He is well-developed.  HENT:     Head: Normocephalic.  Cardiovascular:     Rate and Rhythm: Normal rate and regular rhythm.     Heart sounds: Normal heart sounds.  Pulmonary:     Effort: Pulmonary effort is normal.     Breath sounds: Normal breath sounds.  Musculoskeletal:        General: Normal range of motion.     Cervical back: Normal range of motion.  Skin:    General: Skin is dry.  Neurological:     Mental Status: He is alert and oriented to person, place, and time.     Imaging: No results found.  Labs:  CBC: Recent Labs    06/27/19 1618 08/28/19 1312 09/17/19 1612 11/29/19 0926  WBC 3.6* 3.9* 3.6* 3.7*  HGB 15.2 14.7 14.4 15.4  HCT 43.0 41.6 41.3 43.4  PLT 69* 70* 64.0 Repeated and verified X2.* 72.0*    COAGS: Recent Labs    09/17/19 1612 11/29/19 0926  INR 1.1* 1.2*    BMP: Recent Labs    09/17/19 1612 11/29/19 0926  NA 135 137  K 3.8 4.2  CL 100 101  CO2 27 28  GLUCOSE 256* 170*  BUN 14 10  CALCIUM 9.1 9.1  CREATININE 0.90 0.97    LIVER FUNCTION TESTS: Recent Labs    09/17/19 1612 11/29/19 0926  BILITOT 2.1* 2.9*  AST 30 28  ALT 32 28  ALKPHOS 230* 170*  PROT 6.9 7.1  ALBUMIN 3.9 4.2    TUMOR MARKERS: Recent Labs    09/17/19 1612  AFPTM 3.0    Assessment and Plan:  44 y.o. male outpatient.  History  of DM, cirrhosis with portal hypertension and gastropathy. [place] from 6.18.21 Found to have indeterminate liver lesions while being worked up for thrombocytopenia and splenomegaly. MRI performed for further evaluation on 7.12.21 reads Hepatic cirrhosis. No evidence of hepatic neoplasm or other acute findings Team is requesting a liver biopsy for further determination of possible autoimmune hepatitis.     INR  1.2 PLT, WBC CMP from 11.11.21 shows Total bilirubin 2.9 and alkaline phosphatase as 170  All other labs and medications are within acceptable parameters.  Allergies include prednisone. Patient has been NPO since midnight.  Risks and benefits of liver biopsy was discussed with the patient and/or patient's family including, but not limited to bleeding, infection, damage to adjacent structures or low yield requiring additional tests.  All of the questions were answered and there is agreement to proceed.  Consent signed and in chart.   Thank you for this interesting consult.  I greatly enjoyed meeting Caleb Baker and look forward to participating in their care.  A copy of this report was sent to the requesting provider on this date.  Electronically Signed: Jacqualine Mau, NP 12/11/2019, 7:02 AM   I spent a total of  30 Minutes   in face to face in clinical consultation, greater than 50% of which was counseling/coordinating care for liver biopsy

## 2019-12-11 NOTE — Discharge Instructions (Addendum)
Liver Biopsy  The liver is a large organ in the upper right side of the abdomen. A liver biopsy is a procedure in which a tissue sample is taken from the liver and examined under a microscope. There are three types of liver biopsies:  Percutaneous. A needle is used to remove a sample through an incision in your abdomen.  Laparoscopic. Several incisions are made in the abdomen. A sample is removed with the help of a tiny camera.  Transjugular. An incision is made in your neck in the area of the jugular vein. A sample is removed through a small flexible tube that is passed down the blood vessel and into your liver. Tell a health care provider about:  Any allergies you have.  All medicines you are taking, including vitamins, herbs, eye drops, creams, and over-the-counter medicines.  Any problems you or family members have had with anesthetic medicines.  Any blood disorders you have.  Any surgeries you have had.  Any medical conditions you have.  Whether you are pregnant or may be pregnant. What are the risks? Generally, this is a safe procedure. However, problems can occur and include:  Bleeding.  Infection.  Bruising.  Pain.  Injury to nearby organs or tissues, such as nerves, gallbladder, liver, or lungs. What happens before the procedure? Eating and drinking restrictions  You may be asked not to drink or eat for 6-8 hours before the liver biopsy. You may be allowed to eat a light breakfast. Talk to your health care provider about when you should stop eating and drinking. Medicines Ask your health care provider about:  Changing or stopping your regular medicines. This is especially important if you are taking diabetes medicines or blood thinners.  Taking medicines such as aspirin and ibuprofen. These medicines can thin your blood. Do not take these medicines unless your health care provider tells you to take them.  Taking over-the-counter medicines, vitamins, herbs, and  supplements. General instructions  Do not use any products that contain nicotine or tobacco, such as cigarettes and e-cigarettes. If you need help quitting, ask your health care provider.  Plan to have someone take you home from the hospital or clinic.  Plan to have a responsible adult care for you for at least 24 hours after you leave the hospital or clinic. This is important.  You may have blood or urine tests.  Ask your health care provider what steps will be taken to prevent infection. These may include: ? Removing hair at the surgery site. ? Washing skin with a germ-killing soap. ? Taking antibiotic medicine. What happens during the procedure?  An IV will be inserted into one of your veins. ? You will be given one or more of the following: ? A medicine to help you relax (sedative). ? A medicine to numb the area (local anesthetic). ? A medicine to make you fall asleep (general anesthetic).  Your health care provider will use one of the following procedures to remove samples from your liver. These procedures may vary among health care providers and hospitals. Percutaneous liver biopsy  You will lie on your back, with your right hand over your head.  A health care provider will locate your liver by tapping and pressing on the right side of your abdomen, or by using an ultrasound or CT scan.  A local anesthetic will be used to numb an area at the bottom of your last right rib.  A small incision will be made in the numbed area.    A biopsy needle will be inserted into the incision.  Several samples of liver tissue will be taken. You will be asked to hold your breath as each sample is taken.  The incision will be closed with stitches (sutures).  A bandage (dressing) may be placed over the incision. Laparoscopic liver biopsy  You will lie on your back.  Several small incisions will be made in your abdomen.  Your health care provider will pass a tiny camera through one  incision. The camera will allow the liver to be viewed on a TV monitor in the operating room.  Tools will be passed through the other incision or incisions.  Samples of the liver will be removed using the tools.  The incisions will be closed with stitches (sutures).  A bandage (dressing) may be placed over the incisions. Transjugular liver biopsy  You will lie on your back on an X-ray table, with your head turned to your left.  An area on your neck, just over your jugular vein, will be numbed.  An incision will be made in the numbed area.  A tiny tube will be inserted through the incision. The tube will be passed into the jugular vein to a blood vessel in the liver called the hepatic vein.  A dye will be injected through the tube.  X-rays will be taken. The dye will make the blood vessels in the liver light up on the X-rays.  The biopsy needle will be placed through the tube until it reaches the liver.  Samples of liver tissue will be taken with the biopsy needle.  The needle and the tube will be removed.  The incision will be closed with stitches (sutures).  A bandage (dressing) may be placed over the incision. What happens after the procedure?  Your blood pressure, heart rate, breathing rate, and blood oxygen level will be monitored until you leave the hospital or clinic.  You will be asked to rest quietly for 2-4 hours or longer.  You will be closely monitored for bleeding from the biopsy site.  You may be allowed to go home when the medicines have worn off and you can walk, drink, eat, and use the bathroom. Summary  A liver biopsy is a procedure in which a tissue sample is taken from the liver and examined under a microscope.  This is a safe procedure, but problems can occur, including bleeding, infection, pain, or injury to nearby organs or tissues.  Ask your health care provider about changing or stopping your regular medicines.  Plan to have someone take you  home from the hospital or clinic and to be with you for 24 hours after the procedure. This information is not intended to replace advice given to you by your health care provider. Make sure you discuss any questions you have with your health care provider. Document Revised: 01/14/2017 Document Reviewed: 01/14/2017 Elsevier Patient Education  2020 Elsevier Inc. Moderate Conscious Sedation, Adult Sedation is the use of medicines to promote relaxation and relieve discomfort and anxiety. Moderate conscious sedation is a type of sedation. Under moderate conscious sedation, you are less alert than normal, but you are still able to respond to instructions, touch, or both. Moderate conscious sedation is used during short medical and dental procedures. It is milder than deep sedation, which is a type of sedation under which you cannot be easily woken up. It is also milder than general anesthesia, which is the use of medicines to make you unconscious. Moderate conscious sedation   allows you to return to your regular activities sooner. Tell a health care provider about:  Any allergies you have.  All medicines you are taking, including vitamins, herbs, eye drops, creams, and over-the-counter medicines.  Use of steroids (by mouth or creams).  Any problems you or family members have had with sedatives and anesthetic medicines.  Any blood disorders you have.  Any surgeries you have had.  Any medical conditions you have, such as sleep apnea.  Whether you are pregnant or may be pregnant.  Any use of cigarettes, alcohol, marijuana, or street drugs. What are the risks? Generally, this is a safe procedure. However, problems may occur, including:  Getting too much medicine (oversedation).  Nausea.  Allergic reaction to medicines.  Trouble breathing. If this happens, a breathing tube may be used to help with breathing. It will be removed when you are awake and breathing on your own.  Heart  trouble.  Lung trouble. What happens before the procedure? Staying hydrated Follow instructions from your health care provider about hydration, which may include:  Up to 2 hours before the procedure - you may continue to drink clear liquids, such as water, clear fruit juice, black coffee, and plain tea. Eating and drinking restrictions Follow instructions from your health care provider about eating and drinking, which may include:  8 hours before the procedure - stop eating heavy meals or foods such as meat, fried foods, or fatty foods.  6 hours before the procedure - stop eating light meals or foods, such as toast or cereal.  6 hours before the procedure - stop drinking milk or drinks that contain milk.  2 hours before the procedure - stop drinking clear liquids. Medicine Ask your health care provider about:  Changing or stopping your regular medicines. This is especially important if you are taking diabetes medicines or blood thinners.  Taking medicines such as aspirin and ibuprofen. These medicines can thin your blood. Do not take these medicines before your procedure if your health care provider instructs you not to.  Tests and exams  You will have a physical exam.  You may have blood tests done to show: ? How well your kidneys and liver are working. ? How well your blood can clot. General instructions  Plan to have someone take you home from the hospital or clinic.  If you will be going home right after the procedure, plan to have someone with you for 24 hours. What happens during the procedure?  An IV tube will be inserted into one of your veins.  Medicine to help you relax (sedative) will be given through the IV tube.  The medical or dental procedure will be performed. What happens after the procedure?  Your blood pressure, heart rate, breathing rate, and blood oxygen level will be monitored often until the medicines you were given have worn off.  Do not drive  for 24 hours. This information is not intended to replace advice given to you by your health care provider. Make sure you discuss any questions you have with your health care provider. Document Revised: 12/17/2016 Document Reviewed: 04/26/2015 Elsevier Patient Education  2020 Elsevier Inc.  

## 2019-12-12 ENCOUNTER — Other Ambulatory Visit: Payer: Self-pay | Admitting: Physician Assistant

## 2019-12-18 LAB — SURGICAL PATHOLOGY

## 2019-12-19 DIAGNOSIS — U071 COVID-19: Secondary | ICD-10-CM

## 2019-12-19 HISTORY — DX: COVID-19: U07.1

## 2020-01-04 ENCOUNTER — Encounter (HOSPITAL_COMMUNITY): Payer: Self-pay

## 2020-02-06 ENCOUNTER — Telehealth: Payer: Self-pay | Admitting: Internal Medicine

## 2020-02-06 NOTE — Telephone Encounter (Signed)
I have not, Caleb Baker can you please check with Dr. Luvenia Heller for me. Pls apologize to him for long delay but we will look into this now

## 2020-02-06 NOTE — Telephone Encounter (Signed)
Pt calling for his liver bx results, states they were sent to [facility] for second opinion and he has not heard back. Dr. Hilarie Fredrickson have you received anything back from [facility]? Please advise.

## 2020-02-07 NOTE — Telephone Encounter (Signed)
I was able to obtain the second opinion obtained from hepatopathologist Dr. Earley Abide at [facility].  Findings were as follows:  Extensive bridging fibrosis with multiple nodules consistent with "irregular cirrhosis" stage IV of 4. Comment: Although the portal triads show patchy areas of mild to moderate chronic inflammation, no definitive interface necrosis is seen and no groups of plasma cells are identified.  These findings argue against autoimmune hepatitis.  Although amenable, less than 5%, steatosis is seen the numerous ballooned hepatocytes with Mallory Denk bodies raise the possibility of "burnt out" steatohepatitis.  The differential diagnosis includes but is not limited to the effects of ethanol, metabolic disease associated with fatty liver disease, the effects of drugs and medications and Wilson's disease.  Please let the patient know with the above: 1.  He does not have autoimmune hepatitis 2. he does have cirrhosis, which we were already aware of, and this is likely secondary to fatty liver disease. 3. alcohol contributes to fatty liver disease and so he needs to continue to remain completely alcohol free, he has already been doing this 4. Wilson's disease is a copper storage disease but his ceruloplasmin was normal making this extremely unlikely; I do not think he has Wilson's disease 5. I would like to see him in 2 to 3 months which we talked about previously.  We will follow labs closely and continue to care for his cirrhosis.  I do recommend that he take Vitamin E 800 IU daily which can help in fatty liver disease.  Continue to avoid alcohol which she is doing.  I also recommend that his cholesterol/lipid panel be checked by primary care and if necessary initiate statin therapy.  Cholesterol-lowering drugs can help in fatty liver disease and controlling metabolic risk factors for fatty liver disease such as hyperlipidemia.  We will discuss all at follow-up  Please apologize for the delay  in my receiving his second opinion pathology report Let me know if he has any questions

## 2020-02-07 NOTE — Telephone Encounter (Signed)
Called [facility] and report is supposed to be faxed this am. Requested it come to [phone].

## 2020-02-07 NOTE — Telephone Encounter (Signed)
Dr Hilarie Fredrickson, please see care-everywhere for path result.

## 2020-02-08 NOTE — Telephone Encounter (Signed)
Spoke with pt and he is aware of results and knows to check with PCP regarding cholesterol and to start taking the Vitamin E. Pt scheduled to see Dr. Hilarie Fredrickson 04/17/20 at 2:10pm. Pt aware of appt.

## 2020-02-28 ENCOUNTER — Inpatient Hospital Stay: Payer: BC Managed Care – PPO

## 2020-02-28 ENCOUNTER — Inpatient Hospital Stay: Payer: BC Managed Care – PPO | Admitting: Hematology and Oncology

## 2020-04-17 ENCOUNTER — Ambulatory Visit (INDEPENDENT_AMBULATORY_CARE_PROVIDER_SITE_OTHER): Payer: BC Managed Care – PPO | Admitting: Internal Medicine

## 2020-04-17 ENCOUNTER — Encounter: Payer: Self-pay | Admitting: Internal Medicine

## 2020-04-17 VITALS — BP 138/72 | HR 84 | Ht 73.0 in | Wt 273.0 lb

## 2020-04-17 DIAGNOSIS — K3189 Other diseases of stomach and duodenum: Secondary | ICD-10-CM | POA: Diagnosis not present

## 2020-04-17 DIAGNOSIS — K760 Fatty (change of) liver, not elsewhere classified: Secondary | ICD-10-CM

## 2020-04-17 DIAGNOSIS — K7469 Other cirrhosis of liver: Secondary | ICD-10-CM

## 2020-04-17 DIAGNOSIS — K766 Portal hypertension: Secondary | ICD-10-CM

## 2020-04-17 NOTE — Progress Notes (Signed)
Subjective:    Patient ID: Caleb Baker, male    DOB: 1975/05/15, 45 y.o.   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  He is here today alone and I last saw him in November 2021.  After his last visit we pursued liver biopsy due to confounder of positive ANA and smooth muscle antibody.  His liver biopsy was done locally and I received a second opinion from Dr. Christell Faith with [facility] hepatic pathology.  Dr. Luvenia Heller determined that there were many ballooned hepatocytes with Mallory Denk bodies, minimal macrovesicular steatosis.  No groups of plasma cells.  She reports the differential includes the effects of ethanol, metabolic disease associated fatty liver and Wilson's.  The biopsy was consistent with "irregular cirrhosis" stage IV 4.  Previously his ceruloplasmin was normal.  He reports that he has been doing very well.  He has been completely alcohol abstinent since the diagnosis.  He has been walking and trying to maintain exercise at least 3 days/week.  He also plans to get back in the gym and resume cardio workouts.  He does have intermittent fatigue which he attributes to his low testosterone.  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  He has used Vitamin E 800 IU daily.  He is remained on pantoprazole 40 mg daily.  No jaundice, increasing abdominal girth or lower extremity swelling.  No bleeding including blood in stool or melena.  He has not noticed easy bruising.  No itching.  No confusion.  Review of Systems As per HPI, otherwise negative  Current Medications, Allergies, Past Medical History, Past Surgical History, Family History and Social History were reviewed in [employer] record.     Objective:   Physical Exam BP 138/72   Pulse 84   Ht 6\' 1"  (1.854 m)    Wt 273 lb (123.8 kg)   SpO2 99%   BMI 36.02 kg/m  Gen: awake, alert, NAD HEENT: anicteric  CV: RRR, no mrg Pulm: CTA b/l Abd: soft, NT/ND, +BS throughout Ext: no c/c/e Neuro: nonfocal, no asterixis  CBC    Component Value Date/Time   WBC 3.7 (L) 12/11/2019 0620   RBC 4.83 12/11/2019 0620   HGB 15.5 12/11/2019 0620   HGB 14.7 08/28/2019 1312   HCT 44.2 12/11/2019 0620   PLT 73 (L) 12/11/2019 0620   PLT 70 (L) 08/28/2019 1312   MCV 91.5 12/11/2019 0620   MCH 32.1 12/11/2019 0620   MCHC 35.1 12/11/2019 0620   RDW 13.0 12/11/2019 0620   LYMPHSABS 1.0 11/29/2019 0926   MONOABS 0.3 11/29/2019 0926   EOSABS 0.2 11/29/2019 0926   BASOSABS 0.0 11/29/2019 0926   CMP     Component Value Date/Time   NA 137 11/29/2019 0926   K 4.2 11/29/2019 0926   CL 101 11/29/2019 0926   CO2 28 11/29/2019 0926   GLUCOSE 170 (H) 11/29/2019 0926   BUN 10 11/29/2019 0926   CREATININE 0.97 11/29/2019 0926   CALCIUM 9.1 11/29/2019 0926   PROT 7.1 11/29/2019 0926   ALBUMIN 4.2 11/29/2019 0926   AST 28 11/29/2019 0926   ALT 28 11/29/2019 0926   ALKPHOS 170 (H) 11/29/2019 0926   BILITOT 2.9 (H) 11/29/2019 0926   Lab Results  Component Value Date   INR 1.2 12/11/2019  INR 1.2 (H) 11/29/2019   INR 1.1 (H) 09/17/2019        Assessment & Plan:  45 year old male with a 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  1.  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  He does have risk factors for metabolic fatty liver disease.  He was previously drinking alcohol but has stopped this completely.  I have congratulated him on complete alcohol cessation and advised him to continue to remain alcohol free.  He seems very committed to this.  No evidence of significantly decompensated liver disease at  this time.  We discussed maintenance therapy: --Plan ultrasound of the liver in June and December 2022 for [facility] screening; plan MRI abdomen with and without contrast in June 2023 --Variceal screening up-to-date; endoscopy negative for varices in October 2021, repeat in Oct 2023 --No issues with ascites or volume overload --No issues with hepatic encephalopathy --Continue Vitamin E 800 IU daily --Continue strict alcohol abstinence --Continue to work on regular exercise in controlling risk factors of metabolic fatty liver disease including blood pressure, blood sugar and cholesterol. --Repeat CBC, CMP, INR and AFP in June 2022  2.  History of positive TTG antibody --duodenal biopsy not consistent with celiac disease thus gluten-free diet not currently recommended  3.  Portal gastropathy --continuing daily pantoprazole 40 mg  4.  Sessile serrated polyp --surveillance colonoscopy October 2026  72-month follow-up with me

## 2020-04-17 NOTE — Patient Instructions (Addendum)
Continue pantoprazole 40 mg daily.  Please follow up with Dr Hilarie Fredrickson in 6 months.  You will be due for an abdominal ultrasound for [facility] screening in June 2022. We will contact you with an appointment date/time when the time comes closer.  You will be due for labwork in June 2022. We will contact you when it gets closer to this date/time.  Please continue vitamin E 800 mcg daily.   If you are age 46 or younger, your body mass index should be between 19-25. Your Body mass index is 36.02 kg/m. If this is out of the aformentioned range listed, please consider follow up with your Primary Care Provider.   Due to recent changes in healthcare laws, you may see the results of your imaging and laboratory studies on MyChart before your provider has had a chance to review them.  We understand that in some cases there may be results that are confusing or concerning to you. Not all laboratory results come back in the same time frame and the provider may be waiting for multiple results in order to interpret others.  Please give [place] 48 hours in order for your provider to thoroughly review all the results before contacting the office for clarification of your results.

## 2020-05-26 DIAGNOSIS — I10 Essential (primary) hypertension: Secondary | ICD-10-CM | POA: Diagnosis not present

## 2020-05-26 DIAGNOSIS — N5201 Erectile dysfunction due to arterial insufficiency: Secondary | ICD-10-CM | POA: Diagnosis not present

## 2020-05-26 DIAGNOSIS — E1165 Type 2 diabetes mellitus with hyperglycemia: Secondary | ICD-10-CM | POA: Diagnosis not present

## 2020-05-26 DIAGNOSIS — E7800 Pure hypercholesterolemia, unspecified: Secondary | ICD-10-CM | POA: Diagnosis not present

## 2020-06-25 ENCOUNTER — Telehealth: Payer: Self-pay | Admitting: Internal Medicine

## 2020-06-25 ENCOUNTER — Other Ambulatory Visit: Payer: Self-pay

## 2020-06-25 DIAGNOSIS — K760 Fatty (change of) liver, not elsewhere classified: Secondary | ICD-10-CM

## 2020-06-25 DIAGNOSIS — K766 Portal hypertension: Secondary | ICD-10-CM

## 2020-06-25 DIAGNOSIS — K7469 Other cirrhosis of liver: Secondary | ICD-10-CM

## 2020-06-25 NOTE — Telephone Encounter (Signed)
Patient called said he needs to schedule for an abdominal US to be done now in June that was recommended on his last office visit.

## 2020-06-25 NOTE — Telephone Encounter (Signed)
Order in epic for [place] and pt knows he will get a call from rad scheduling to set the [place] up.

## 2020-06-30 DIAGNOSIS — E7800 Pure hypercholesterolemia, unspecified: Secondary | ICD-10-CM | POA: Diagnosis not present

## 2020-07-01 ENCOUNTER — Telehealth: Payer: Self-pay

## 2020-07-01 NOTE — Telephone Encounter (Signed)
-----   Message from Larina Bras, [place] sent at 11/29/2019  9:21 AM EST ----- Pt needs MRI abd with/without for [facility] screen dx cirrhosis in July 2022--- see office note 11/29/19

## 2020-07-01 NOTE — Telephone Encounter (Signed)
See OV from 04-17-2020. Patient scheduled for abdominal U/S on 07-04-20

## 2020-07-04 ENCOUNTER — Ambulatory Visit (HOSPITAL_COMMUNITY)
Admission: RE | Admit: 2020-07-04 | Discharge: 2020-07-04 | Disposition: A | Discharge status: Home / Self Care | Payer: BC Managed Care – PPO | Source: Ambulatory Visit | Attending: Internal Medicine | Admitting: Internal Medicine

## 2020-07-04 ENCOUNTER — Other Ambulatory Visit: Payer: Self-pay

## 2020-07-04 DIAGNOSIS — K760 Fatty (change of) liver, not elsewhere classified: Secondary | ICD-10-CM

## 2020-07-04 DIAGNOSIS — K7469 Other cirrhosis of liver: Secondary | ICD-10-CM | POA: Insufficient documentation

## 2020-07-04 DIAGNOSIS — N281 Cyst of kidney, acquired: Secondary | ICD-10-CM | POA: Diagnosis not present

## 2020-07-04 DIAGNOSIS — K766 Portal hypertension: Secondary | ICD-10-CM | POA: Insufficient documentation

## 2020-07-04 DIAGNOSIS — K7460 Unspecified cirrhosis of liver: Secondary | ICD-10-CM | POA: Diagnosis not present

## 2020-07-07 ENCOUNTER — Other Ambulatory Visit: Payer: Self-pay | Admitting: Internal Medicine

## 2020-07-07 DIAGNOSIS — K7469 Other cirrhosis of liver: Secondary | ICD-10-CM

## 2020-10-20 ENCOUNTER — Telehealth: Payer: Self-pay | Admitting: *Deleted

## 2020-10-20 DIAGNOSIS — K766 Portal hypertension: Secondary | ICD-10-CM

## 2020-10-20 DIAGNOSIS — K3189 Other diseases of stomach and duodenum: Secondary | ICD-10-CM

## 2020-10-20 DIAGNOSIS — K7460 Unspecified cirrhosis of liver: Secondary | ICD-10-CM

## 2020-10-20 NOTE — Telephone Encounter (Signed)
I have left a voicemail for patient to call back. 

## 2020-10-20 NOTE — Telephone Encounter (Signed)
-----   Message from Larina Bras, [place] sent at 09/15/2020  2:07 PM EDT ----- Pt has been tentatively scheduled to see Dr Hilarie Fredrickson on 01/06/21 at 1010 (he will need to be informed of this appt). Patient needs to be scheduled for [facility] screen prior to this appt and will need labs as well (labs entered in epic) _____________________________  Pt needs u/s abdomen limited for NAFLD cirrhosis, HCC screening around 12/18/20. Also needs CBC, CMP, INR, AFP around 12/18/20. Needs follow up office visit around 12/18/20 as well...Marland KitchenMarland Kitchen see 04/17/20 office note  ----- Message ----- From: Larina Bras, CMA Sent: 09/15/2020  12:00 AM EDT To: Larina Bras, CMA  Pt needs u/s abdomen limited for NAFLD cirrhosis, HCC screening around 10/18/20. Also needs CBC, CMP, INR, AFP around 10/18/20. Needs follow up office visit around 10/18/20 as well...Marland KitchenMarland Kitchen see 04/17/20 office note

## 2020-10-22 NOTE — Telephone Encounter (Signed)
I have spoken to patient to advise that he is scheduled to see Dr Hilarie Fredrickson on 01/06/21 at 1010 am. He verbalizes understanding. He has also been scheduled to have abdominal ultrasound for [facility] screen at [facility] radiology on 12/29/20. Patient has been advised to be NPO for this test. In addition, he should have labs on that on 12/29/20. Lab orders are in epic.

## 2020-11-02 ENCOUNTER — Other Ambulatory Visit: Payer: Self-pay | Admitting: Internal Medicine

## 2020-11-02 DIAGNOSIS — K2970 Gastritis, unspecified, without bleeding: Secondary | ICD-10-CM

## 2020-11-02 DIAGNOSIS — K2990 Gastroduodenitis, unspecified, without bleeding: Secondary | ICD-10-CM

## 2020-11-26 ENCOUNTER — Other Ambulatory Visit: Payer: Self-pay | Admitting: Internal Medicine

## 2020-11-26 DIAGNOSIS — K2970 Gastritis, unspecified, without bleeding: Secondary | ICD-10-CM

## 2020-12-27 ENCOUNTER — Other Ambulatory Visit: Payer: Self-pay | Admitting: Internal Medicine

## 2020-12-27 DIAGNOSIS — K2970 Gastritis, unspecified, without bleeding: Secondary | ICD-10-CM

## 2020-12-29 ENCOUNTER — Other Ambulatory Visit: Payer: Self-pay

## 2020-12-29 ENCOUNTER — Ambulatory Visit (HOSPITAL_COMMUNITY)
Admission: RE | Admit: 2020-12-29 | Discharge: 2020-12-29 | Disposition: A | Discharge status: Home / Self Care | Payer: BC Managed Care – PPO | Source: Ambulatory Visit | Attending: Internal Medicine | Admitting: Internal Medicine

## 2020-12-29 ENCOUNTER — Other Ambulatory Visit (INDEPENDENT_AMBULATORY_CARE_PROVIDER_SITE_OTHER): Payer: BC Managed Care – PPO

## 2020-12-29 DIAGNOSIS — K3189 Other diseases of stomach and duodenum: Secondary | ICD-10-CM

## 2020-12-29 DIAGNOSIS — K7460 Unspecified cirrhosis of liver: Secondary | ICD-10-CM | POA: Diagnosis not present

## 2020-12-29 DIAGNOSIS — K7469 Other cirrhosis of liver: Secondary | ICD-10-CM | POA: Insufficient documentation

## 2020-12-29 DIAGNOSIS — R161 Splenomegaly, not elsewhere classified: Secondary | ICD-10-CM | POA: Diagnosis not present

## 2020-12-29 DIAGNOSIS — K766 Portal hypertension: Secondary | ICD-10-CM | POA: Diagnosis not present

## 2020-12-29 LAB — PROTIME-INR
INR: 1.2 ratio — ABNORMAL HIGH (ref 0.8–1.0)
Prothrombin Time: 13 s (ref 9.6–13.1)

## 2020-12-29 LAB — COMPREHENSIVE METABOLIC PANEL
ALT: 37 U/L (ref 0–53)
AST: 30 U/L (ref 0–37)
Albumin: 3.9 g/dL (ref 3.5–5.2)
Alkaline Phosphatase: 152 U/L — ABNORMAL HIGH (ref 39–117)
BUN: 13 mg/dL (ref 6–23)
CO2: 29 mEq/L (ref 19–32)
Calcium: 9.2 mg/dL (ref 8.4–10.5)
Chloride: 104 mEq/L (ref 96–112)
Creatinine, Ser: 0.9 mg/dL (ref 0.40–1.50)
GFR: 102.54 mL/min (ref >60.00)
Glucose, Bld: 126 mg/dL — ABNORMAL HIGH (ref 70–99)
Potassium: 4.2 mEq/L (ref 3.5–5.1)
Sodium: 140 mEq/L (ref 135–145)
Total Bilirubin: 3 mg/dL — ABNORMAL HIGH (ref 0.2–1.2)
Total Protein: 6.8 g/dL (ref 6.0–8.3)

## 2020-12-29 LAB — CBC WITH DIFFERENTIAL/PLATELET
Basophils Absolute: 0 10*3/uL (ref 0.0–0.1)
Basophils Relative: 0.8 % (ref 0.0–3.0)
Eosinophils Absolute: 0.2 10*3/uL (ref 0.0–0.7)
Eosinophils Relative: 4.9 % (ref 0.0–5.0)
HCT: 41 % (ref 39.0–52.0)
Hemoglobin: 14.3 g/dL (ref 13.0–17.0)
Lymphocytes Relative: 29.6 % (ref 12.0–46.0)
Lymphs Abs: 1 10*3/uL (ref 0.7–4.0)
MCHC: 34.9 g/dL (ref 30.0–36.0)
MCV: 89.4 fl (ref 78.0–100.0)
Monocytes Absolute: 0.3 10*3/uL (ref 0.1–1.0)
Monocytes Relative: 8.5 % (ref 3.0–12.0)
Neutro Abs: 1.8 10*3/uL (ref 1.4–7.7)
Neutrophils Relative %: 56.2 % (ref 43.0–77.0)
Platelets: 66 10*3/uL — ABNORMAL LOW (ref 150.0–400.0)
RBC: 4.59 Mil/uL (ref 4.22–5.81)
RDW: 14.2 % (ref 11.5–15.5)
WBC: 3.2 10*3/uL — ABNORMAL LOW (ref 4.0–10.5)

## 2020-12-30 LAB — AFP TUMOR MARKER: AFP-Tumor Marker: 2 ng/mL (ref <6.1)

## 2021-01-06 ENCOUNTER — Ambulatory Visit (INDEPENDENT_AMBULATORY_CARE_PROVIDER_SITE_OTHER): Payer: BC Managed Care – PPO | Admitting: Internal Medicine

## 2021-01-06 ENCOUNTER — Encounter: Payer: Self-pay | Admitting: Internal Medicine

## 2021-01-06 VITALS — BP 144/76 | HR 83 | Ht 73.0 in | Wt 274.0 lb

## 2021-01-06 DIAGNOSIS — K7469 Other cirrhosis of liver: Secondary | ICD-10-CM | POA: Diagnosis not present

## 2021-01-06 DIAGNOSIS — K760 Fatty (change of) liver, not elsewhere classified: Secondary | ICD-10-CM

## 2021-01-06 DIAGNOSIS — K3189 Other diseases of stomach and duodenum: Secondary | ICD-10-CM

## 2021-01-06 DIAGNOSIS — K766 Portal hypertension: Secondary | ICD-10-CM | POA: Diagnosis not present

## 2021-01-06 NOTE — Patient Instructions (Signed)
Continue Vitamin E as directed.  Let's try to reduce weight by 5-10% within the next year. Exercise and balanced diet are key!  Follow up with Dr Hilarie Fredrickson in the office after your MRI.  You will be due for an MRI of the abdomen with contrast in June 2023. We will contact you when it gets closer to that time.  If you are age 46 or older, your body mass index should be between 23-30. Your Body mass index is 36.15 kg/m. If this is out of the aforementioned range listed, please consider follow up with your Primary Care Provider.  If you are age 68 or younger, your body mass index should be between 19-25. Your Body mass index is 36.15 kg/m. If this is out of the aformentioned range listed, please consider follow up with your Primary Care Provider.   ________________________________________________________  The  GI providers would like to encourage you to use [facility] to communicate with providers for non-urgent requests or questions.  Due to long hold times on the telephone, sending your provider a message by [facility] may be a faster and more efficient way to get a response.  Please allow 48 business hours for a response.  Please remember that this is for non-urgent requests.  _______________________________________________________  Due to recent changes in healthcare laws, you may see the results of your imaging and laboratory studies on MyChart before your provider has had a chance to review them.  We understand that in some cases there may be results that are confusing or concerning to you. Not all laboratory results come back in the same time frame and the provider may be waiting for multiple results in order to interpret others.  Please give [place] 48 hours in order for your provider to thoroughly review all the results before contacting the office for clarification of your results.

## 2021-01-06 NOTE — Progress Notes (Signed)
Subjective:    Patient ID: Caleb Baker, male    DOB: April 14, 1975, 45 y.o.   MRN: [id number]  HPI Kevontae Burgoon is a 45 year old male with a history of NASH cirrhosis with portal hypertension, family history of cirrhosis, history of SSP who is here for follow-up.  He was last seen on 04/17/2020.  He is here alone today.  He reports that he has been feeling well.  He is having no specific abdominal complaint.  No abdominal pain.  No abdominal or lower extremity swelling.  No nausea or vomiting.  No troublesome heartburn or trouble swallowing.  Regular bowel movements without blood in stool or melena.  No jaundice or itching.  He continues to work.  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  They worked on this house for over a year.  They were living at [facility] prior to this.  They are now living in [place].  He has exercise less this year that he wanted to but he hopes to get back to more regular exercise in the new year.   Review of Systems As per HPI, otherwise negative  Current Medications, Allergies, Past Medical History, Past Surgical History, Family History and Social History were reviewed in [employer] record.    Objective:   Physical Exam BP (!) 144/76    Pulse 83    Ht 6\' 1"  (1.854 m)    Wt 274 lb (124.3 kg)    SpO2 98%    BMI 36.15 kg/m  Gen: awake, alert, NAD HEENT: anicteric CV: RRR, no mrg Pulm: CTA b/l Abd: soft, NT/ND, +BS throughout Ext: no c/c/e Neuro: nonfocal  ABDOMEN ULTRASOUND COMPLETE   COMPARISON:  Abdominal ultrasound 07/04/2020, abdominal MRI 07/30/2019   FINDINGS: Gallbladder: Physiologically distended. There is mild gallbladder wall thickening measuring up to 4 mm. No pericholecystic fluid. No gallstones. No sonographic Murphy sign noted by sonographer.   Common bile duct: Diameter: 4 mm, normal.   Liver: Heterogeneous hepatic parenchyma, coarsened  echogenicity. Slight lobulated hepatic contours. No discrete focal hepatic lesion. Portal vein is patent on color Doppler imaging with normal direction of blood flow towards the liver.   IVC: No abnormality visualized.   Pancreas: Visualized portion unremarkable. Distal body and tail are obscured.   Spleen: Enlarged measuring 15.8 x 8.5 x 18.1 cm, volume of 1271 cc. No focal splenic abnormality.   Right Kidney: Length: 12.3 cm. Normal parenchymal echogenicity. No hydronephrosis. 1.9 cm cyst in the lower kidney. No visualized stone or solid lesion.   Left Kidney: Length: 11.7 cm. Normal parenchymal echogenicity. No hydronephrosis. No visualized stone or focal lesion.   Abdominal aorta: No aneurysm visualized.   Other findings: No abdominal ascites.   IMPRESSION: 1. Cirrhotic hepatic morphology.  No focal hepatic lesion. 2. Splenomegaly.  No ascites. 3. Mild gallbladder wall thickening with seen on prior exam, favored to represent sequela of chronic liver disease. No gallstones or findings of acute cholecystitis.     Electronically Signed   By: Keith Rake M.D.   On: 12/30/2020 13:09  CBC    Component Value Date/Time   WBC 3.2 (L) 12/29/2020 1022   RBC 4.59 12/29/2020 1022   HGB 14.3 12/29/2020 1022   HGB 14.7 08/28/2019 1312   HCT 41.0 12/29/2020 1022   PLT 66.0 (L) 12/29/2020 1022   PLT 70 (L) 08/28/2019 1312   MCV 89.4 12/29/2020 1022   MCH 32.1 12/11/2019 0620  MCHC 34.9 12/29/2020 1022   RDW 14.2 12/29/2020 1022   LYMPHSABS 1.0 12/29/2020 1022   MONOABS 0.3 12/29/2020 1022   EOSABS 0.2 12/29/2020 1022   BASOSABS 0.0 12/29/2020 1022   CMP     Component Value Date/Time   NA 140 12/29/2020 1022   K 4.2 12/29/2020 1022   CL 104 12/29/2020 1022   CO2 29 12/29/2020 1022   GLUCOSE 126 (H) 12/29/2020 1022   BUN 13 12/29/2020 1022   CREATININE 0.90 12/29/2020 1022   CALCIUM 9.2 12/29/2020 1022   PROT 6.8 12/29/2020 1022   ALBUMIN 3.9 12/29/2020 1022    AST 30 12/29/2020 1022   ALT 37 12/29/2020 1022   ALKPHOS 152 (H) 12/29/2020 1022   BILITOT 3.0 (H) 12/29/2020 1022   Lab Results  Component Value Date   INR 1.2 (H) 12/29/2020   INR 1.2 12/11/2019   INR 1.2 (H) 11/29/2019        Assessment & Plan:  45 year old male with a history of NASH cirrhosis with portal hypertension, family history of cirrhosis, history of SSP who is here for follow-up.   Metabolic fatty liver disease (NASH) cirrhosis with portal hypertension --prior liver biopsy ruled out AIH.  His liver disease has been stable and he is feeling well.  We reviewed the goal of 5 to 10% weight reduction over the next 12 months.  I recommended a high-fiber, low carbohydrate diet with focus on fresh fruits and vegetables.  Exercise 30 minutes 3 to 4 days/week is recommended. --Variceal screening; up-to-date.  EGD negative for varices and October 2021; consider repeat October 2023 --No issues with hepatic encephalopathy, ascites, or jaundice --He has been alcohol abstinent and I recommended he continue to strictly avoid alcohol --We discussed diet and exercise as above --[place] screening up-to-date with ultrasound; I recommended MRI abdomen with and without contrast for [facility] screening in June 2023 --Office follow-up 2 to 6 weeks after repeat imaging  2.  History of portal gastropathy --continuing daily pantoprazole 40 mg  3.  History of positive TTG antibody --duodenal biopsy negative for celiac disease and thus gluten-free diet is not recommended  4.  History of sessile serrated polyp --surveillance colonoscopy recommended around October 2026

## 2021-03-27 ENCOUNTER — Other Ambulatory Visit: Payer: Self-pay | Admitting: Internal Medicine

## 2021-03-27 DIAGNOSIS — K2990 Gastroduodenitis, unspecified, without bleeding: Secondary | ICD-10-CM

## 2021-03-27 DIAGNOSIS — K2970 Gastritis, unspecified, without bleeding: Secondary | ICD-10-CM

## 2021-04-30 DIAGNOSIS — E785 Hyperlipidemia, unspecified: Secondary | ICD-10-CM | POA: Diagnosis not present

## 2021-04-30 DIAGNOSIS — I10 Essential (primary) hypertension: Secondary | ICD-10-CM | POA: Diagnosis not present

## 2021-04-30 DIAGNOSIS — E118 Type 2 diabetes mellitus with unspecified complications: Secondary | ICD-10-CM | POA: Diagnosis not present

## 2021-04-30 DIAGNOSIS — N5201 Erectile dysfunction due to arterial insufficiency: Secondary | ICD-10-CM | POA: Diagnosis not present

## 2021-05-15 ENCOUNTER — Telehealth: Payer: Self-pay | Admitting: *Deleted

## 2021-05-15 DIAGNOSIS — K7469 Other cirrhosis of liver: Secondary | ICD-10-CM

## 2021-05-15 DIAGNOSIS — K760 Fatty (change of) liver, not elsewhere classified: Secondary | ICD-10-CM

## 2021-05-15 NOTE — Telephone Encounter (Signed)
-----   Message from Larina Bras, [place] sent at 01/06/2021 11:13 AM EST ----- ?Patient needs MRI abdomen with contrast around 06/2021 for his nafld cirrhosis. He also needs office visit shortly after for follow up. See 01/06/22 office note. ? ?

## 2021-05-15 NOTE — Telephone Encounter (Signed)
Patient has been scheduled for MRI abdomen w/wo at [facility] on 06/29/21 (Monday) at 10 am, 930 am arrival. NPO 6 hours prior. He will also needs labs prior to appt.  ?Office appt scheduled for 07/23/21 at 910 am with Dr Hilarie Fredrickson. ? ?I have left a message for patient to call back so I can advise of this information. ?

## 2021-05-18 NOTE — Telephone Encounter (Signed)
Patient returned your call. I advised patient of upcoming MRI, and OV with Dr. Hilarie Fredrickson.  ?

## 2021-06-22 ENCOUNTER — Other Ambulatory Visit: Payer: Self-pay | Admitting: Internal Medicine

## 2021-06-22 DIAGNOSIS — K2970 Gastritis, unspecified, without bleeding: Secondary | ICD-10-CM

## 2021-06-24 ENCOUNTER — Other Ambulatory Visit (INDEPENDENT_AMBULATORY_CARE_PROVIDER_SITE_OTHER): Payer: BC Managed Care – PPO

## 2021-06-24 ENCOUNTER — Telehealth: Payer: Self-pay

## 2021-06-24 DIAGNOSIS — K7469 Other cirrhosis of liver: Secondary | ICD-10-CM | POA: Diagnosis not present

## 2021-06-24 DIAGNOSIS — K760 Fatty (change of) liver, not elsewhere classified: Secondary | ICD-10-CM | POA: Diagnosis not present

## 2021-06-24 LAB — CREATININE, SERUM: Creatinine, Ser: 0.84 mg/dL (ref 0.40–1.50)

## 2021-06-24 LAB — BUN: BUN: 11 mg/dL (ref 6–23)

## 2021-06-24 NOTE — Telephone Encounter (Signed)
-----   Message from [place] sent at 06/22/2021 10:59 AM EDT ----- Regarding: labs! Left a voicemail reminder to have labs completed.  ----- Message ----- From: Larina Bras, CMA Sent: 06/22/2021  12:00 AM EDT To: Larina Bras, CMA  Make sure patient goes for bun creatinine prior to [facility] 06/29/21

## 2021-06-24 NOTE — Telephone Encounter (Signed)
Left another VM for patient to go the lab

## 2021-06-29 ENCOUNTER — Ambulatory Visit (HOSPITAL_COMMUNITY)
Admission: RE | Admit: 2021-06-29 | Discharge: 2021-06-29 | Disposition: A | Discharge status: Home / Self Care | Payer: BC Managed Care – PPO | Source: Ambulatory Visit | Attending: Internal Medicine | Admitting: Internal Medicine

## 2021-06-29 DIAGNOSIS — K760 Fatty (change of) liver, not elsewhere classified: Secondary | ICD-10-CM | POA: Diagnosis not present

## 2021-06-29 DIAGNOSIS — I8510 Secondary esophageal varices without bleeding: Secondary | ICD-10-CM | POA: Diagnosis not present

## 2021-06-29 DIAGNOSIS — K7469 Other cirrhosis of liver: Secondary | ICD-10-CM

## 2021-06-29 DIAGNOSIS — K7460 Unspecified cirrhosis of liver: Secondary | ICD-10-CM | POA: Diagnosis not present

## 2021-06-29 DIAGNOSIS — K766 Portal hypertension: Secondary | ICD-10-CM | POA: Diagnosis not present

## 2021-06-29 DIAGNOSIS — D696 Thrombocytopenia, unspecified: Secondary | ICD-10-CM | POA: Diagnosis not present

## 2021-06-29 MED ORDER — GADOBUTROL 1 MMOL/ML IV SOLN
10.0000 mL | Freq: Once | INTRAVENOUS | Status: AC | PRN
  Administered 2021-06-29: 10 mL via INTRAVENOUS

## 2021-07-01 ENCOUNTER — Other Ambulatory Visit: Payer: Self-pay

## 2021-07-01 DIAGNOSIS — K760 Fatty (change of) liver, not elsewhere classified: Secondary | ICD-10-CM

## 2021-07-13 ENCOUNTER — Other Ambulatory Visit (INDEPENDENT_AMBULATORY_CARE_PROVIDER_SITE_OTHER): Payer: BC Managed Care – PPO

## 2021-07-13 DIAGNOSIS — K760 Fatty (change of) liver, not elsewhere classified: Secondary | ICD-10-CM

## 2021-07-13 LAB — CBC WITH DIFFERENTIAL/PLATELET
Basophils Absolute: 0 10*3/uL (ref 0.0–0.1)
Basophils Relative: 0.9 % (ref 0.0–3.0)
Eosinophils Absolute: 0.1 10*3/uL (ref 0.0–0.7)
Eosinophils Relative: 3.7 % (ref 0.0–5.0)
HCT: 40.8 % (ref 39.0–52.0)
Hemoglobin: 14.2 g/dL (ref 13.0–17.0)
Lymphocytes Relative: 25.9 % (ref 12.0–46.0)
Lymphs Abs: 1 10*3/uL (ref 0.7–4.0)
MCHC: 34.9 g/dL (ref 30.0–36.0)
MCV: 90.7 fl (ref 78.0–100.0)
Monocytes Absolute: 0.3 10*3/uL (ref 0.1–1.0)
Monocytes Relative: 6.2 % (ref 3.0–12.0)
Neutro Abs: 2.6 10*3/uL (ref 1.4–7.7)
Neutrophils Relative %: 63.3 % (ref 43.0–77.0)
Platelets: 65 10*3/uL — ABNORMAL LOW (ref 150.0–400.0)
RBC: 4.49 Mil/uL (ref 4.22–5.81)
RDW: 14.6 % (ref 11.5–15.5)
WBC: 4.1 10*3/uL (ref 4.0–10.5)

## 2021-07-13 LAB — COMPREHENSIVE METABOLIC PANEL
ALT: 17 U/L (ref 0–53)
AST: 20 U/L (ref 0–37)
Albumin: 4 g/dL (ref 3.5–5.2)
Alkaline Phosphatase: 114 U/L (ref 39–117)
BUN: 14 mg/dL (ref 6–23)
CO2: 27 mEq/L (ref 19–32)
Calcium: 8.9 mg/dL (ref 8.4–10.5)
Chloride: 104 mEq/L (ref 96–112)
Creatinine, Ser: 0.98 mg/dL (ref 0.40–1.50)
GFR: 92.23 mL/min (ref >60.00)
Glucose, Bld: 170 mg/dL — ABNORMAL HIGH (ref 70–99)
Potassium: 4.4 mEq/L (ref 3.5–5.1)
Sodium: 138 mEq/L (ref 135–145)
Total Bilirubin: 2.7 mg/dL — ABNORMAL HIGH (ref 0.2–1.2)
Total Protein: 6.6 g/dL (ref 6.0–8.3)

## 2021-07-13 LAB — PROTIME-INR
INR: 1.3 ratio — ABNORMAL HIGH (ref 0.8–1.0)
Prothrombin Time: 13.6 s — ABNORMAL HIGH (ref 9.6–13.1)

## 2021-07-14 LAB — AFP TUMOR MARKER: AFP-Tumor Marker: 2 ng/mL (ref <6.1)

## 2021-07-17 ENCOUNTER — Encounter: Payer: Self-pay | Admitting: *Deleted

## 2021-07-20 ENCOUNTER — Other Ambulatory Visit: Payer: Self-pay | Admitting: Internal Medicine

## 2021-07-20 DIAGNOSIS — K2970 Gastritis, unspecified, without bleeding: Secondary | ICD-10-CM

## 2021-07-23 ENCOUNTER — Ambulatory Visit: Payer: BC Managed Care – PPO | Admitting: Internal Medicine

## 2021-07-28 ENCOUNTER — Encounter: Payer: Self-pay | Admitting: Internal Medicine

## 2021-07-28 ENCOUNTER — Ambulatory Visit (INDEPENDENT_AMBULATORY_CARE_PROVIDER_SITE_OTHER): Payer: BC Managed Care – PPO | Admitting: Internal Medicine

## 2021-07-28 VITALS — BP 134/72 | HR 85 | Ht 73.0 in | Wt 255.0 lb

## 2021-07-28 DIAGNOSIS — K3189 Other diseases of stomach and duodenum: Secondary | ICD-10-CM | POA: Diagnosis not present

## 2021-07-28 DIAGNOSIS — K7460 Unspecified cirrhosis of liver: Secondary | ICD-10-CM | POA: Diagnosis not present

## 2021-07-28 DIAGNOSIS — K760 Fatty (change of) liver, not elsewhere classified: Secondary | ICD-10-CM

## 2021-07-28 DIAGNOSIS — K7581 Nonalcoholic steatohepatitis (NASH): Secondary | ICD-10-CM | POA: Diagnosis not present

## 2021-07-28 DIAGNOSIS — K766 Portal hypertension: Secondary | ICD-10-CM

## 2021-07-28 DIAGNOSIS — Z8719 Personal history of other diseases of the digestive system: Secondary | ICD-10-CM

## 2021-07-28 NOTE — Patient Instructions (Signed)
If you are age 47 or older, your body mass index should be between 23-30. Your Body mass index is 33.64 kg/m. If this is out of the aforementioned range listed, please consider follow up with your Primary Care Provider.  If you are age 23 or younger, your body mass index should be between 19-25. Your Body mass index is 33.64 kg/m. If this is out of the aformentioned range listed, please consider follow up with your Primary Care Provider.   Continue Pantoprazole and Vitamin E.   You have been scheduled for an endoscopy. Please follow written instructions given to you at your visit today. If you use inhalers (even only as needed), please bring them with you on the day of your procedure.   The [facility] providers would like to encourage you to use [facility] to communicate with providers for non-urgent requests or questions.  Due to long hold times on the telephone, sending your provider a message by [facility] may be a faster and more efficient way to get a response.  Please allow 48 business hours for a response.  Please remember that this is for non-urgent requests.   It was a pleasure to see you today!  Thank you for trusting me with your gastrointestinal care!    Zenovia Jarred, MD

## 2021-07-29 ENCOUNTER — Encounter: Payer: Self-pay | Admitting: Internal Medicine

## 2021-07-29 NOTE — Progress Notes (Signed)
Subjective:    Patient ID: Caleb Baker, male    DOB: 1975-02-05, 46 y.o.   MRN: [id number]  HPI Caleb Baker is a 46 year old male with a history of Karlene Lineman cirrhosis with portal hypertension, family history of cirrhosis, history of SSP who is here for follow-up.  He is here today with his wife and I last saw him on 01/06/2021.  He reports that he is feeling great.  He has been successful at losing 20 pounds through diet and exercise.  He has changed his diet by reducing portions.  He is still able to eat the foods that he likes he is just eating less volume.  He is also exercising on a near daily basis.  He started out with a walking program and now he is doing half walking and half running.  He feels considerably better.  He denies abdominal pain.  Heartburn is not an issue.  No black stool or melena.  No increasing abdominal girth or lower extremity edema.  No itching.  No jaundice.  [place] [facility].  He continues to work full-time.   Review of Systems As per HPI, otherwise negative  Current Medications, Allergies, Past Medical History, Past Surgical History, Family History and Social History were reviewed in [employer] record.     Objective:   Physical Exam BP 134/72   Pulse 85   Ht '6\' 1"'$  (1.854 m)   Wt 255 lb (115.7 kg)   BMI 33.64 kg/m  Gen: awake, alert, NAD HEENT: anicteric  CV: RRR, no mrg Pulm: CTA b/l Abd: soft, NT/ND, +BS throughout Ext: no c/c/e Neuro: nonfocal  MRI ABDOMEN WITHOUT AND WITH CONTRAST   TECHNIQUE: Multiplanar multisequence MR imaging of the abdomen was performed both before and after the administration of intravenous contrast.   CONTRAST:  79m GADAVIST GADOBUTROL 1 MMOL/ML IV SOLN   COMPARISON:  07/30/2019   FINDINGS: Lower chest: No acute findings.   Hepatobiliary: Hepatic cirrhosis is again demonstrated. No hepatic masses identified. Recanalization of paraumbilical veins again  seen, consistent with portal venous hypertension. Gallbladder is unremarkable. No evidence of biliary ductal dilatation.   Pancreas:  No mass or inflammatory changes.   Spleen: Marked splenomegaly again seen, consistent with portal venous hypertension.   Adrenals/Urinary Tract: No masses identified. Small benign-appearing right renal cysts again noted (no followup imaging recommended). No evidence of hydronephrosis.   Stomach/Bowel: Unremarkable.   Vascular/Lymphatic: No pathologically enlarged lymph nodes identified. No acute vascular findings. Abdominal portosystemic venous collaterals and esophageal varices are again seen, consistent with portal venous hypertension. No evidence of ascites.   Other:  None.   Musculoskeletal:  No suspicious bone lesions identified.   IMPRESSION: Hepatic cirrhosis, with findings of portal venous hypertension including prominent esophageal varices.   No evidence of hepatic neoplasm or other acute findings.     Electronically Signed   By: JMarlaine HindM.D.   On: 06/30/2021 09:06      Latest Ref Rng & Units 07/13/2021   12:18 PM 12/29/2020   10:22 AM 12/11/2019    6:20 AM  CBC  WBC 4.0 - 10.5 K/uL 4.1  3.2  3.7   Hemoglobin 13.0 - 17.0 g/dL 14.2  14.3  15.5   Hematocrit 39.0 - 52.0 % 40.8  41.0  44.2   Platelets 150.0 - 400.0 K/uL 65.0 Repeated and verified X2.  66.0  73    Lab Results  Component Value Date  INR 1.3 (H) 07/13/2021   INR 1.2 (H) 12/29/2020   INR 1.2 12/11/2019   CMP     Component Value Date/Time   NA 138 07/13/2021 1218   K 4.4 07/13/2021 1218   CL 104 07/13/2021 1218   CO2 27 07/13/2021 1218   GLUCOSE 170 (H) 07/13/2021 1218   BUN 14 07/13/2021 1218   CREATININE 0.98 07/13/2021 1218   CALCIUM 8.9 07/13/2021 1218   PROT 6.6 07/13/2021 1218   ALBUMIN 4.0 07/13/2021 1218   AST 20 07/13/2021 1218   ALT 17 07/13/2021 1218   ALKPHOS 114 07/13/2021 1218   BILITOT 2.7 (H) 07/13/2021 1218       Assessment  & Plan:  46 year old male with a history of [place] cirrhosis with portal hypertension, family history of cirrhosis, history of SSP who is here for follow-up.   NASH cirrhosis with portal hypertension/liver biopsy excluded AIH --he looks great today and has been successful at losing 20 pounds which is remarkable through diet and exercise.  I congratulated him on the success and recommended he continue diet, exercise regimen. --Continue vitamin E 800 IU daily --Variceal screening EGD recommended in September or October this year; we reviewed the risk, benefits and alternatives and he is agreeable and wishes to proceed --[place] screening MRI performed recently and negative; I recommended we alternate ultrasound and MRI.  Ultrasound will be needed every 6 months.  Plan: Abdominal ultrasound for [place] screening in June 2024, December 2024 and then repeat MRI abdomen with and without contrast June 2025 --Continue alcohol avoidance strictly --52-monthfollow-up with me possibly 6 months after upper endoscopy assuming he continues to do well  2.  History of portal gastropathy --continue daily PPI  3.  History of positive TTG antibody --duodenal biopsy negative for celiac disease and thus he does not need gluten-free diet at this time  4.  History of sessile serrated polyp --surveillance colonoscopy October 2026

## 2021-10-12 ENCOUNTER — Encounter: Payer: Self-pay | Admitting: Internal Medicine

## 2021-10-12 ENCOUNTER — Ambulatory Visit (AMBULATORY_SURGERY_CENTER): Payer: BC Managed Care – PPO | Admitting: Internal Medicine

## 2021-10-12 VITALS — BP 119/74 | HR 73 | Temp 98.9°F | Resp 17 | Ht 73.0 in | Wt 255.0 lb

## 2021-10-12 DIAGNOSIS — K766 Portal hypertension: Secondary | ICD-10-CM | POA: Diagnosis not present

## 2021-10-12 DIAGNOSIS — R768 Other specified abnormal immunological findings in serum: Secondary | ICD-10-CM

## 2021-10-12 DIAGNOSIS — K2970 Gastritis, unspecified, without bleeding: Secondary | ICD-10-CM | POA: Diagnosis not present

## 2021-10-12 DIAGNOSIS — K7460 Unspecified cirrhosis of liver: Secondary | ICD-10-CM | POA: Diagnosis not present

## 2021-10-12 DIAGNOSIS — K7581 Nonalcoholic steatohepatitis (NASH): Secondary | ICD-10-CM | POA: Diagnosis not present

## 2021-10-12 DIAGNOSIS — I8500 Esophageal varices without bleeding: Secondary | ICD-10-CM | POA: Diagnosis not present

## 2021-10-12 DIAGNOSIS — K2990 Gastroduodenitis, unspecified, without bleeding: Secondary | ICD-10-CM

## 2021-10-12 MED ORDER — PANTOPRAZOLE SODIUM 40 MG PO TBEC: 40.0000 mg | DELAYED_RELEASE_TABLET | Freq: Every day | ORAL | 3 refills | Status: DC

## 2021-10-12 MED ORDER — CARVEDILOL 3.125 MG PO TABS: 3.1250 mg | ORAL_TABLET | Freq: Two times a day (BID) | ORAL | 11 refills | Status: DC

## 2021-10-12 MED ORDER — SODIUM CHLORIDE 0.9 % IV SOLN: 500.0000 mL | Freq: Once | INTRAVENOUS | Status: AC

## 2021-10-12 NOTE — Progress Notes (Signed)
Called to room to assist during endoscopic procedure.  Patient ID and intended procedure confirmed with present staff. Received instructions for my participation in the procedure from the performing physician.  

## 2021-10-12 NOTE — Patient Instructions (Signed)
Impression/Recommendations:  Gastritis handout given to patient.  Resume previous diet. Continue present medications including daily PPI.  Begin Carvedilol 3.125 mg. 2 times daily for prevention of variceal hemorrhage.  Await pathology results.  Repeat EGD not needed for varices assuming he remains on beta blocker and liver disease remains compensated.  YOU HAD AN ENDOSCOPIC PROCEDURE TODAY AT [facility]:   Refer to the procedure report that was given to you for any specific questions about what was found during the examination.  If the procedure report does not answer your questions, please call your gastroenterologist to clarify.  If you requested that your care partner not be given the details of your procedure findings, then the procedure report has been included in a sealed envelope for you to review at your convenience later.  YOU SHOULD EXPECT: Some feelings of bloating in the abdomen. Passage of more gas than usual.  Walking can help get rid of the air that was put into your GI tract during the procedure and reduce the bloating. If you had a lower endoscopy (such as a colonoscopy or flexible sigmoidoscopy) you may notice spotting of blood in your stool or on the toilet paper. If you underwent a bowel prep for your procedure, you may not have a normal bowel movement for a few days.  Please Note:  You might notice some irritation and congestion in your nose or some drainage.  This is from the oxygen used during your procedure.  There is no need for concern and it should clear up in a day or so.  SYMPTOMS TO REPORT IMMEDIATELY:  Following upper endoscopy (EGD)  Vomiting of blood or coffee ground material  New chest pain or pain under the shoulder blades  Painful or persistently difficult swallowing  New shortness of breath  Fever of 100F or higher  Black, tarry-looking stools  For urgent or emergent issues, a gastroenterologist can be reached at any hour by  calling [phone]. Do not use MyChart messaging for urgent concerns.    DIET:  We do recommend a small meal at first, but then you may proceed to your regular diet.  Drink plenty of fluids but you should avoid alcoholic beverages for 24 hours.  ACTIVITY:  You should plan to take it easy for the rest of today and you should NOT DRIVE or use heavy machinery until tomorrow (because of the sedation medicines used during the test).    FOLLOW UP: Our staff will call the number listed on your records the next business day following your procedure.  We will call around 7:15- 8:00 am to check on you and address any questions or concerns that you may have regarding the information given to you following your procedure. If we do not reach you, we will leave a message.     If any biopsies were taken you will be contacted by phone or by letter within the next 1-3 weeks.  Please call us at [phone] if you have not heard about the biopsies in 3 weeks.    SIGNATURES/CONFIDENTIALITY: You and/or your care partner have signed paperwork which will be entered into your electronic medical record.  These signatures attest to the fact that that the information above on your After Visit Summary has been reviewed and is understood.  Full responsibility of the confidentiality of this discharge information lies with you and/or your care-partner.

## 2021-10-12 NOTE — Progress Notes (Signed)
GASTROENTEROLOGY PROCEDURE H&P NOTE   Primary Care Physician: Shirline Frees, MD    Reason for Procedure:   Variceal screening  Plan:    EGD  Patient is appropriate for endoscopic procedure(s) in the ambulatory ([place]) setting.  The nature of the procedure, as well as the risks, benefits, and alternatives were carefully and thoroughly reviewed with the patient. Ample time for discussion and questions allowed. The patient understood, was satisfied, and agreed to proceed.     HPI: Caleb Baker is a 46 y.o. male who presents for EGD.  Medical history as below.  No recent chest pain or shortness of breath.  No abdominal pain today.  Past Medical History:  Diagnosis Date   Asthma    Cirrhosis ([place])    COVID-19 12/2019   Diabetes ([place])    Esophageal varices (H[facility])    Hypertension    Portal venous hypertension (H[facility])    Renal cyst    Thrombocytopenia (H[facility])     Past Surgical History:  Procedure Laterality Date   COLONOSCOPY     HERNIA REPAIR     UPPER GASTROINTESTINAL ENDOSCOPY     WISDOM TOOTH EXTRACTION      Prior to Admission medications   Medication Sig Start Date End Date Taking? Authorizing Provider  lisinopril (PRINIVIL,ZESTRIL) 10 MG tablet Take 10 mg by mouth daily.   Yes [provider]  metFORMIN (GLUCOPHAGE-XR) 500 MG 24 hr tablet Take 500 mg by mouth 2 (two) times daily.   Yes [provider]  naproxen sodium (ALEVE) 220 MG tablet Take 220 mg by mouth daily as needed (pain).   Yes [provider]  pantoprazole (PROTONIX) 40 MG tablet TAKE 1 TABLET BY MOUTH EVERY DAY 07/20/21  Yes Jsean Taussig, Lajuan Lines, MD  albuterol (VENTOLIN HFA) 108 (90 Base) MCG/ACT inhaler Inhale 2 puffs into the lungs every 6 (six) hours as needed for wheezing or shortness of breath.    [provider]  sildenafil (VIAGRA) 100 MG tablet as needed. 11/27/19   [provider]  vitamin E 180 MG (400 UNITS) capsule Take 400 Units by mouth in the morning and  at bedtime.    [provider]    Current Outpatient Medications  Medication Sig Dispense Refill   lisinopril (PRINIVIL,ZESTRIL) 10 MG tablet Take 10 mg by mouth daily.     metFORMIN (GLUCOPHAGE-XR) 500 MG 24 hr tablet Take 500 mg by mouth 2 (two) times daily.     naproxen sodium (ALEVE) 220 MG tablet Take 220 mg by mouth daily as needed (pain).     pantoprazole (PROTONIX) 40 MG tablet TAKE 1 TABLET BY MOUTH EVERY DAY 90 tablet 1   albuterol (VENTOLIN HFA) 108 (90 Base) MCG/ACT inhaler Inhale 2 puffs into the lungs every 6 (six) hours as needed for wheezing or shortness of breath.     sildenafil (VIAGRA) 100 MG tablet as needed.     vitamin E 180 MG (400 UNITS) capsule Take 400 Units by mouth in the morning and at bedtime.     Current Facility-Administered Medications  Medication Dose Route Frequency Provider Last Rate Last Admin   0.9 %  sodium chloride infusion  500 mL Intravenous Once Lachrisha Ziebarth, Lajuan Lines, MD        Allergies as of 10/12/2021 - Review Complete 10/12/2021  Allergen Reaction Noted   Prednisone Nausea And Vomiting 03/17/2018    Family History  Problem Relation Age of Onset   Liver disease Father    Liver disease  Paternal Grandmother    Colon cancer Neg Hx    Stomach cancer Neg Hx    Pancreatic cancer Neg Hx    Esophageal cancer Neg Hx     Social History   Socioeconomic History   Marital status: Married    Spouse name: Not on file   Number of children: Not on file   Years of education: Not on file   Highest education level: Not on file  Occupational History   Not on file  Tobacco Use   Smoking status: Never   Smokeless tobacco: Former    Types: Snuff    Quit date: 01/2017  Vaping Use   Vaping Use: Never used  Substance and Sexual Activity   Alcohol use: Not Currently    Comment: social; hasn't had a drink since May 2021   Drug use: Never   Sexual activity: Not Currently  Other Topics Concern   Not on file  Social History Narrative   Not on  file   Social Determinants of Health   Financial Resource Strain: Not on file  Food Insecurity: Not on file  Transportation Needs: Not on file  Physical Activity: Not on file  Stress: Not on file  Social Connections: Not on file  Intimate Partner Violence: Not on file    Physical Exam: Vital signs in last 24 hours: '@BP'$  (!) 130/96   Pulse 76   Temp 98.9 F (37.2 C) (Skin)   Ht '6\' 1"'$  (1.854 m)   Wt 255 lb (115.7 kg)   SpO2 99%   BMI 33.64 kg/m  GEN: NAD EYE: Sclerae anicteric ENT: MMM CV: Non-tachycardic Pulm: CTA b/l GI: Soft, NT/ND NEURO:  Alert & Oriented x 3   Zenovia Jarred, MD [facility]  10/12/2021 9:54 AM

## 2021-10-12 NOTE — Op Note (Signed)
[place] Patient Name: Caleb Baker Procedure Date: 10/12/2021 10:04 AM MRN: [id number] Endoscopist: Jerene Bears , MD Age: 46 Referring MD:  Date of Birth: 11-28-1975 Gender: Male Account #: [id number] Procedure:                Upper GI endoscopy Indications:              Cirrhosis rule out esophageal varices, last EGD 2                            year ago; hx of esophagitis/gastroduodenitis, hx of                            + TTG ab, hx of portal HTN gastropathy Medicines:                Monitored Anesthesia Care Procedure:                Pre-Anesthesia Assessment:                           - Prior to the procedure, a History and Physical                            was performed, and patient medications and                            allergies were reviewed. The patient's tolerance of                            previous anesthesia was also reviewed. The risks                            and benefits of the procedure and the sedation                            options and risks were discussed with the patient.                            All questions were answered, and informed consent                            was obtained. Prior Anticoagulants: The patient has                            taken no previous anticoagulant or antiplatelet                            agents. ASA Grade Assessment: III - A patient with                            severe systemic disease. After reviewing the risks                            and benefits, the patient was deemed in  satisfactory condition to undergo the procedure.                           After obtaining informed consent, the endoscope was                            passed under direct vision. Throughout the                            procedure, the patient's blood pressure, pulse, and                            oxygen saturations were monitored continuously. The                            GIF [id number]  #[id number] was introduced through the                            mouth, and advanced to the second part of duodenum.                            The upper GI endoscopy was accomplished without                            difficulty. The patient tolerated the procedure                            well. Scope In: Scope Out: Findings:                 Two columns of grade II varices with no bleeding                            and no stigmata of recent bleeding were found in                            the middle third of the esophagus and in the lower                            third of the esophagus. They were small to medium                            in size.                           The Z-line was regular and was found 40 cm from the                            incisors. No esophagitis.                           Moderate portal hypertensive gastropathy was found                            in  the cardia, in the gastric fundus and in the                            gastric body.                           Moderate inflammation characterized by nodularity,                            congestion (edema) and erythema was found in the                            gastric antrum. Biopsies were taken with a cold                            forceps for histology and Helicobacter pylori                            testing.                           The examined duodenum was normal. Biopsies for                            histology were taken with a cold forceps for                            evaluation of celiac disease. Complications:            No immediate complications. Estimated Blood Loss:     Estimated blood loss was minimal. Impression:               - Grade II esophageal varices with no bleeding and                            no stigmata of recent bleeding.                           - Z-line regular, 40 cm from the incisors. No                            further esophagitis.                            - Portal hypertensive gastropathy.                           - Gastritis. Biopsied.                           - Normal examined duodenum. Biopsied. Recommendation:           - Patient has a contact number available for                            emergencies. The signs and symptoms of potential  delayed complications were discussed with the                            patient. Return to normal activities tomorrow.                            Written discharge instructions were provided to the                            patient.                           - Resume previous diet.                           - Continue present medications including daily PPI.                           - Begin carvedilol 3.125 mg BID for prophylaxis of                            variceal hemorrhage. Repeat EGD will not be needed                            for screening/surveillance for varices assuming he                            remains on beta blocker and liver disease remains                            compensated.                           - Await pathology results. Jerene Bears, MD 10/12/2021 10:22:50 AM This report has been signed electronically.

## 2021-10-12 NOTE — Progress Notes (Signed)
Pt resting comfortably. VSS. Airway intact. SBAR complete to RN. All questions answered.   

## 2021-10-13 ENCOUNTER — Telehealth: Payer: Self-pay | Admitting: *Deleted

## 2021-10-13 NOTE — Telephone Encounter (Signed)
  Follow up Call-     10/12/2021    9:40 AM 11/09/2019    1:12 PM  Call back number  Post procedure Call Back phone  # [phone]  Permission to leave phone message Yes Yes     Patient questions:  Do you have a fever, pain , or abdominal swelling? No. Pain Score  0 *  Have you tolerated food without any problems? Yes.    Have you been able to return to your normal activities? Yes.    Do you have any questions about your discharge instructions: Diet   No. Medications  No. Follow up visit  No.  Do you have questions or concerns about your Care? No.  Actions: * If pain score is 4 or above: No action needed, pain <4.

## 2021-10-15 ENCOUNTER — Encounter: Payer: Self-pay | Admitting: Internal Medicine

## 2021-10-26 DIAGNOSIS — E1165 Type 2 diabetes mellitus with hyperglycemia: Secondary | ICD-10-CM | POA: Diagnosis not present

## 2021-10-26 DIAGNOSIS — Z125 Encounter for screening for malignant neoplasm of prostate: Secondary | ICD-10-CM | POA: Diagnosis not present

## 2021-10-26 DIAGNOSIS — Z0000 Encounter for general adult medical examination without abnormal findings: Secondary | ICD-10-CM | POA: Diagnosis not present

## 2021-11-03 ENCOUNTER — Other Ambulatory Visit: Payer: Self-pay | Admitting: Internal Medicine

## 2021-11-16 DIAGNOSIS — M67814 Other specified disorders of tendon, left shoulder: Secondary | ICD-10-CM | POA: Diagnosis not present

## 2021-11-16 DIAGNOSIS — M542 Cervicalgia: Secondary | ICD-10-CM | POA: Diagnosis not present

## 2021-11-16 DIAGNOSIS — M25512 Pain in left shoulder: Secondary | ICD-10-CM | POA: Diagnosis not present

## 2021-12-06 IMAGING — US US ABDOMEN COMPLETE
1 series · 13 of 25 positions shown · non-contrast
Comparison: None.

CLINICAL DATA: Thrombocytopenia.  Splenomegaly.

EXAM:
ABDOMEN ULTRASOUND COMPLETE

[Series 1: us abdomen complete · 13 of 140 slices shown]
[im 1/140]
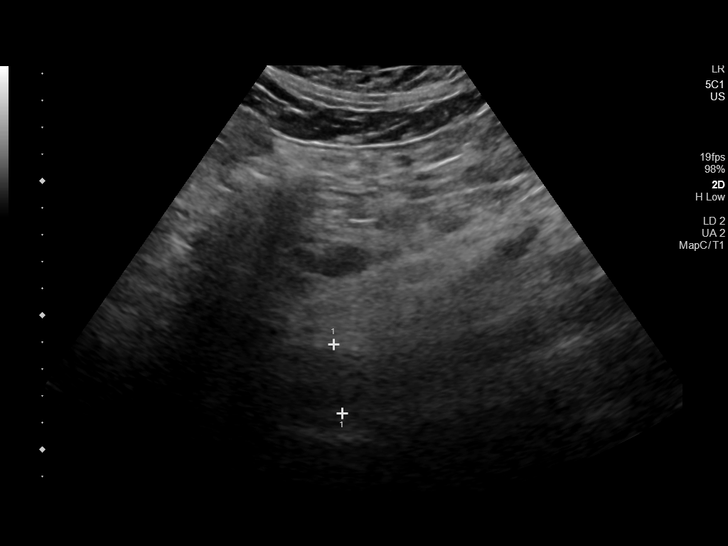
[im 12/140]
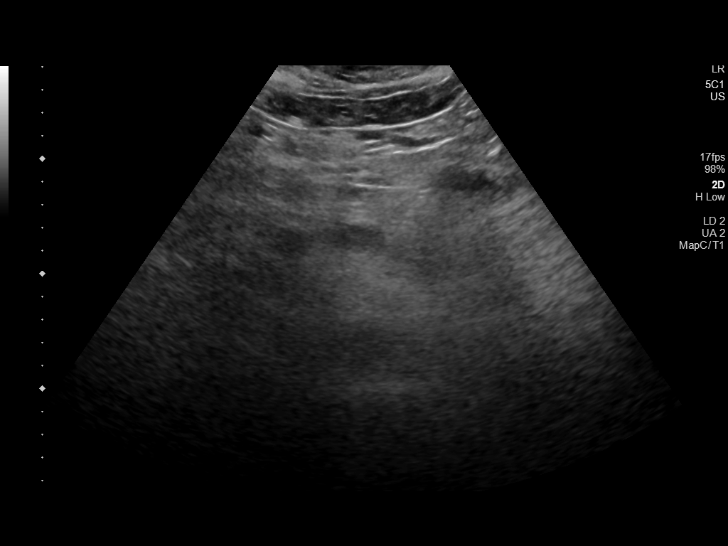
[im 24/140]
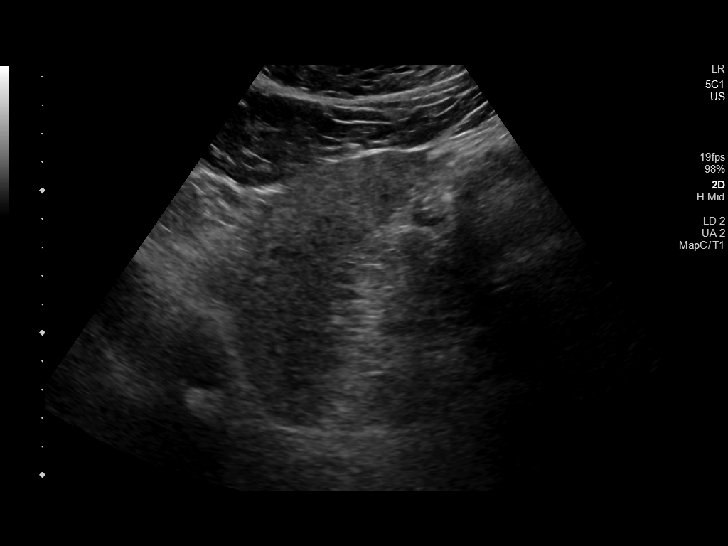
[im 35/140]
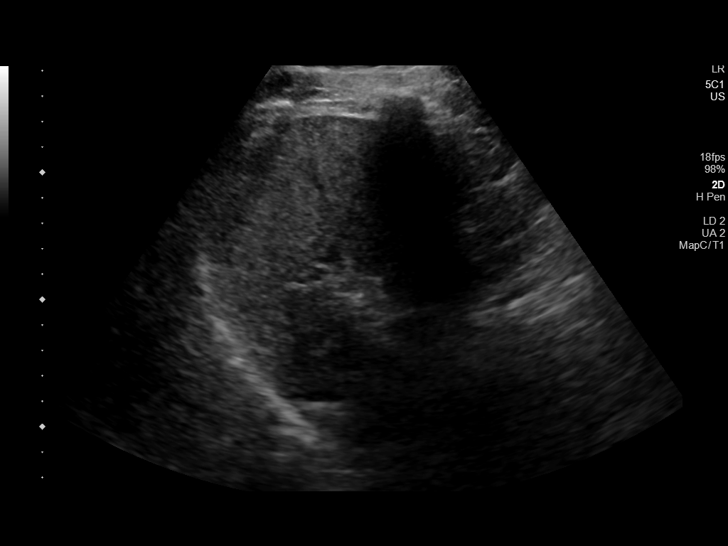
[im 47/140]
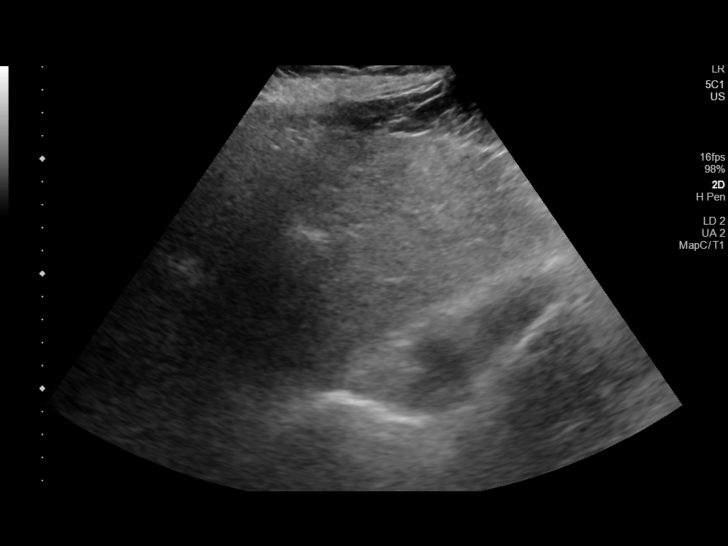
[im 58/140]
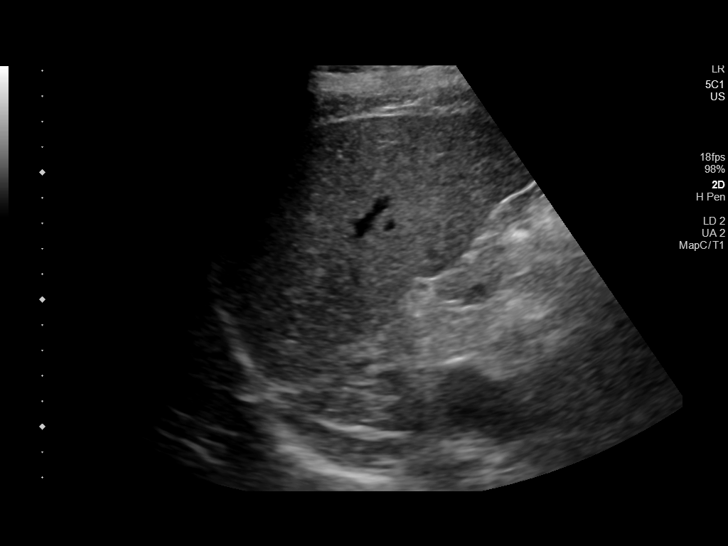
[im 70/140]
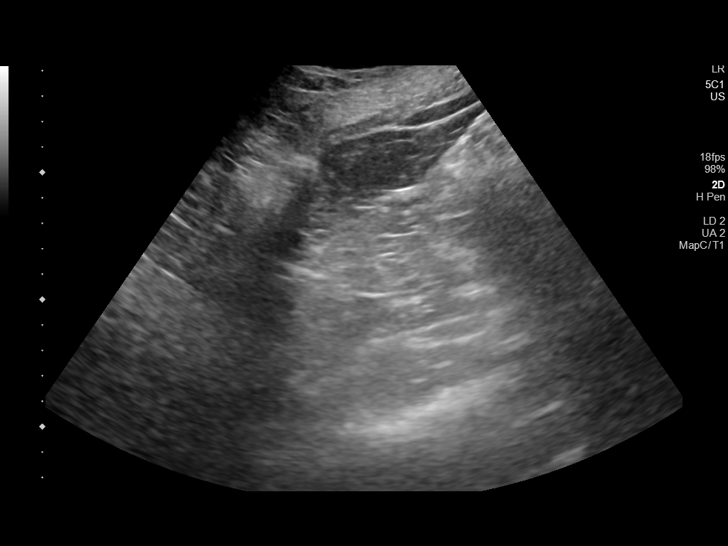
[im 82/140]
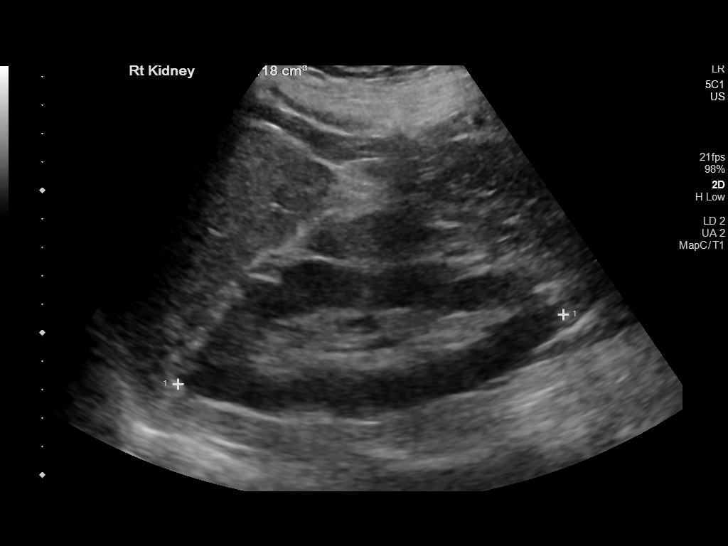
[im 93/140]
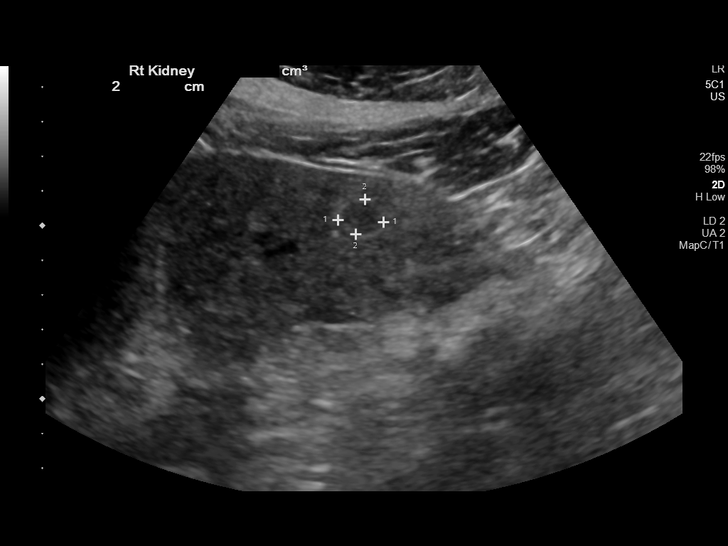
[im 105/140]
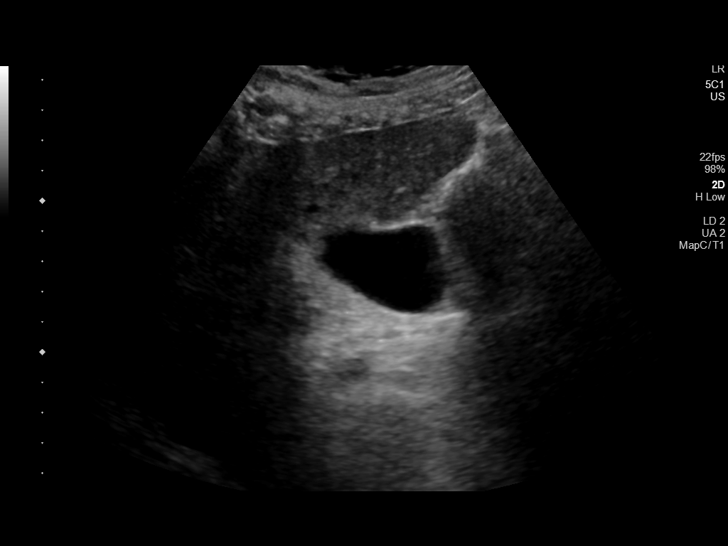
[im 116/140]
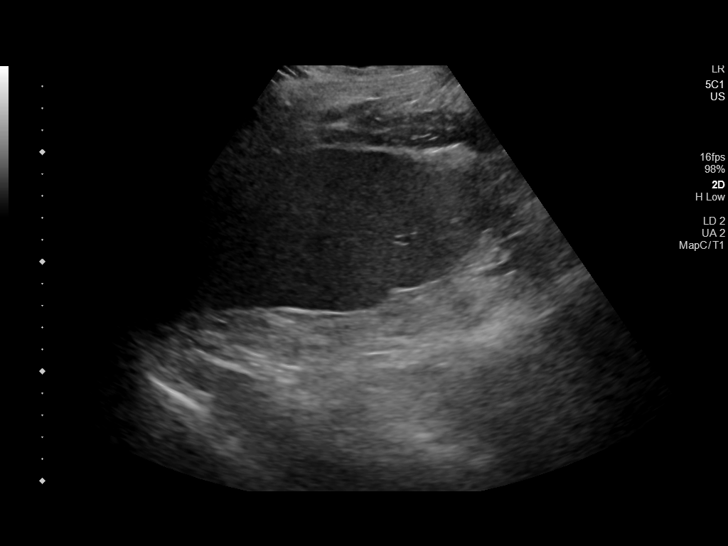
[im 128/140]
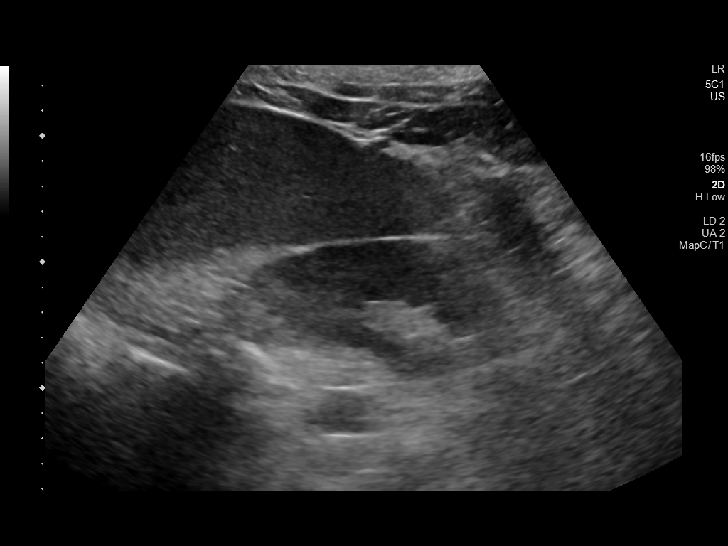
[im 140/140]
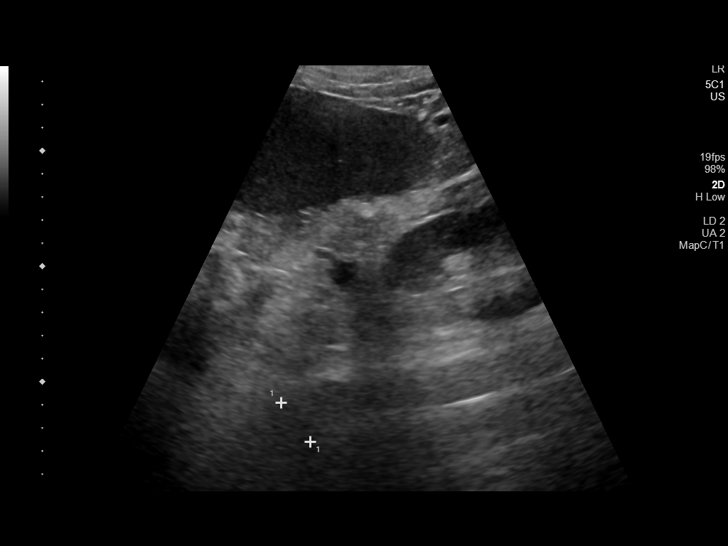

[13 of 25 positions shown; findings below may reference images not displayed]

FINDINGS: Gallbladder: No gallstones or wall thickening visualized. No
sonographic Murphy sign noted by sonographer.

Common bile duct: Diameter: 4 mm, within normal limits

Liver: Liver is diffusely heterogeneous. 1.3 cm indeterminate lesion
is present in the left lobe. Overall liver size and contour is
normal. No other lesions are definitely present. Portal vein is
patent on color Doppler imaging with normal direction of blood flow
towards the liver.

IVC: No abnormality visualized.

Pancreas: Visualized portion unremarkable.

Spleen: Maximum transverse diameter is 16 cm, enlarged. Estimated
volume is 5333 mL.

Right Kidney: Length: 12.0 cm. Parenchyma echogenicity is normal. A
2.8 x 2.2 x 2.8 cm irregular hypoechoic lesion is present near the
lower pole of the right kidney. No other focal lesions are present.
No stone or obstruction is present.

Left Kidney: Length: 12.1 cm, within normal limits. Echogenicity
within normal limits. No mass or hydronephrosis visualized.

Abdominal aorta: No aneurysm visualized.

Other findings: None.
IMPRESSION: 1. Heterogeneous hyperechoic echo it liver likely represents diffuse
fatty infiltration.
2. 1.3 cm indeterminate lesion in the left lobe of the liver.
Recommend MRI without and with contrast for further evaluation.
3. 2.8 cm irregular lesion at the lower pole of the left kidney.
This may represent a complex cyst. This area should also be
evaluated with MRI without and with contrast.
4. Splenomegaly. No discrete lesions are present. Portal vein is
unremarkable.

## 2022-02-18 ENCOUNTER — Other Ambulatory Visit: Payer: Self-pay

## 2022-02-18 ENCOUNTER — Telehealth: Payer: Self-pay | Admitting: Internal Medicine

## 2022-02-18 DIAGNOSIS — K2970 Gastritis, unspecified, without bleeding: Secondary | ICD-10-CM

## 2022-02-18 MED ORDER — CARVEDILOL 3.125 MG PO TABS: ORAL_TABLET | ORAL | 4 refills | Status: DC

## 2022-02-18 MED ORDER — PANTOPRAZOLE SODIUM 40 MG PO TBEC: 40.0000 mg | DELAYED_RELEASE_TABLET | Freq: Every day | ORAL | 3 refills | Status: AC

## 2022-02-18 NOTE — Telephone Encounter (Signed)
Patient is calling states insurance has changed to where his Rx needs to be faxed to [phone] Account # [id number] and needs to be changed to 90 days. Informed patient he would receive a call back with any questions.

## 2022-02-18 NOTE — Telephone Encounter (Signed)
Called patient to confirm which medications needed to be sent and which pharmacy. Patient stated that he had to use [facility] now due to insurance. I let him know that prescriptions could be sent electronically.

## 2022-05-20 DIAGNOSIS — E1165 Type 2 diabetes mellitus with hyperglycemia: Secondary | ICD-10-CM | POA: Diagnosis not present

## 2022-05-20 DIAGNOSIS — I10 Essential (primary) hypertension: Secondary | ICD-10-CM | POA: Diagnosis not present

## 2022-05-20 DIAGNOSIS — E7800 Pure hypercholesterolemia, unspecified: Secondary | ICD-10-CM | POA: Diagnosis not present

## 2022-05-20 DIAGNOSIS — K7469 Other cirrhosis of liver: Secondary | ICD-10-CM | POA: Diagnosis not present

## 2022-07-05 ENCOUNTER — Other Ambulatory Visit: Payer: Self-pay | Admitting: *Deleted

## 2022-07-05 ENCOUNTER — Telehealth: Payer: Self-pay | Admitting: *Deleted

## 2022-07-05 DIAGNOSIS — K7469 Other cirrhosis of liver: Secondary | ICD-10-CM

## 2022-07-05 DIAGNOSIS — K766 Portal hypertension: Secondary | ICD-10-CM

## 2022-07-05 NOTE — Telephone Encounter (Signed)
-----   Message from Chrystie Nose, RN sent at 07/05/2022  8:04 AM EDT ----- Regarding: FW: MR abd w w/o  ----- Message ----- From: Chrystie Nose, RN Sent: 07/02/2022  12:00 AM EDT To: Chrystie Nose, RN Subject: MR abd w w/o                                   Pt needs MR abd w w/o HCC Screening

## 2022-07-05 NOTE — Telephone Encounter (Signed)
Patient called and notified an MRI ABD was ordered via Dr. Margretta Sidle and imaging will be contacting to schedule an appt. Patient understood and agreed.

## 2022-07-14 ENCOUNTER — Ambulatory Visit (HOSPITAL_COMMUNITY)
Admission: RE | Admit: 2022-07-14 | Discharge: 2022-07-14 | Disposition: A | Discharge status: Home / Self Care | Payer: 59 | Source: Ambulatory Visit | Attending: Internal Medicine | Admitting: Internal Medicine

## 2022-07-14 DIAGNOSIS — K7469 Other cirrhosis of liver: Secondary | ICD-10-CM | POA: Insufficient documentation

## 2022-07-14 DIAGNOSIS — K766 Portal hypertension: Secondary | ICD-10-CM | POA: Diagnosis present

## 2022-07-14 MED ORDER — GADOBUTROL 1 MMOL/ML IV SOLN
10.0000 mL | Freq: Once | INTRAVENOUS | Status: AC | PRN
Start: 2022-07-14 — End: 2022-07-14
  Administered 2022-07-14: 10 mL via INTRAVENOUS

## 2022-10-21 ENCOUNTER — Ambulatory Visit: Payer: 59 | Admitting: Physician Assistant

## 2023-02-22 ENCOUNTER — Encounter: Payer: Self-pay | Admitting: Internal Medicine

## 2023-02-22 ENCOUNTER — Ambulatory Visit (INDEPENDENT_AMBULATORY_CARE_PROVIDER_SITE_OTHER): Payer: 59 | Admitting: Internal Medicine

## 2023-02-22 ENCOUNTER — Other Ambulatory Visit (INDEPENDENT_AMBULATORY_CARE_PROVIDER_SITE_OTHER): Payer: 59

## 2023-02-22 VITALS — BP 104/60 | HR 88 | Ht 72.5 in | Wt 272.1 lb

## 2023-02-22 DIAGNOSIS — Z860101 Personal history of adenomatous and serrated colon polyps: Secondary | ICD-10-CM

## 2023-02-22 DIAGNOSIS — I8510 Secondary esophageal varices without bleeding: Secondary | ICD-10-CM

## 2023-02-22 DIAGNOSIS — K3189 Other diseases of stomach and duodenum: Secondary | ICD-10-CM

## 2023-02-22 DIAGNOSIS — K7460 Unspecified cirrhosis of liver: Secondary | ICD-10-CM | POA: Diagnosis not present

## 2023-02-22 DIAGNOSIS — K766 Portal hypertension: Secondary | ICD-10-CM

## 2023-02-22 DIAGNOSIS — Z8719 Personal history of other diseases of the digestive system: Secondary | ICD-10-CM

## 2023-02-22 DIAGNOSIS — K7581 Nonalcoholic steatohepatitis (NASH): Secondary | ICD-10-CM | POA: Diagnosis not present

## 2023-02-22 LAB — COMPREHENSIVE METABOLIC PANEL
ALT: 28 U/L (ref 0–53)
AST: 36 U/L (ref 0–37)
Albumin: 3.5 g/dL (ref 3.5–5.2)
Alkaline Phosphatase: 123 U/L — ABNORMAL HIGH (ref 39–117)
BUN: 14 mg/dL (ref 6–23)
CO2: 26 meq/L (ref 19–32)
Calcium: 8.2 mg/dL — ABNORMAL LOW (ref 8.4–10.5)
Chloride: 106 meq/L (ref 96–112)
Creatinine, Ser: 0.95 mg/dL (ref 0.40–1.50)
GFR: 94.66 mL/min (ref >60.00)
Glucose, Bld: 157 mg/dL — ABNORMAL HIGH (ref 70–99)
Potassium: 3.8 meq/L (ref 3.5–5.1)
Sodium: 139 meq/L (ref 135–145)
Total Bilirubin: 2.6 mg/dL — ABNORMAL HIGH (ref 0.2–1.2)
Total Protein: 6 g/dL (ref 6.0–8.3)

## 2023-02-22 LAB — CBC WITH DIFFERENTIAL/PLATELET
Basophils Absolute: 0 10*3/uL (ref 0.0–0.1)
Basophils Relative: 1.1 % (ref 0.0–3.0)
Eosinophils Absolute: 0.1 10*3/uL (ref 0.0–0.7)
Eosinophils Relative: 4.2 % (ref 0.0–5.0)
HCT: 34.9 % — ABNORMAL LOW (ref 39.0–52.0)
Hemoglobin: 12.6 g/dL — ABNORMAL LOW (ref 13.0–17.0)
Lymphocytes Relative: 37.7 % (ref 12.0–46.0)
Lymphs Abs: 0.8 10*3/uL (ref 0.7–4.0)
MCHC: 36 g/dL (ref 30.0–36.0)
MCV: 89.2 fL (ref 78.0–100.0)
Monocytes Absolute: 0.3 10*3/uL (ref 0.1–1.0)
Monocytes Relative: 12.2 % — ABNORMAL HIGH (ref 3.0–12.0)
Neutro Abs: 1 10*3/uL — ABNORMAL LOW (ref 1.4–7.7)
Neutrophils Relative %: 44.8 % (ref 43.0–77.0)
Platelets: 55 10*3/uL — ABNORMAL LOW (ref 150.0–400.0)
RBC: 3.91 Mil/uL — ABNORMAL LOW (ref 4.22–5.81)
RDW: 14.2 % (ref 11.5–15.5)
WBC: 2.2 10*3/uL — ABNORMAL LOW (ref 4.0–10.5)

## 2023-02-22 LAB — PROTIME-INR
INR: 1.3 {ratio} — ABNORMAL HIGH (ref 0.8–1.0)
Prothrombin Time: 13.6 s — ABNORMAL HIGH (ref 9.6–13.1)

## 2023-02-22 MED ORDER — CARVEDILOL 6.25 MG PO TABS: 6.2500 mg | ORAL_TABLET | Freq: Two times a day (BID) | ORAL | Status: DC

## 2023-02-22 NOTE — Patient Instructions (Signed)
 Your provider has requested that you go to the basement level for lab work before leaving today. Press B on the elevator. The lab is located at the first door on the left as you exit the elevator.  Increase your carvedilol  to 6.25 mg twice daily.   We will contact you in July to scheduled your ultrasound of the liver.  _______________________________________________________  If your blood pressure at your visit was 140/90 or greater, please contact your primary care physician to follow up on this.  _______________________________________________________  If you are age 61 or older, your body mass index should be between 23-30. Your Body mass index is 36.4 kg/m. If this is out of the aforementioned range listed, please consider follow up with your Primary Care Provider.  If you are age 13 or younger, your body mass index should be between 19-25. Your Body mass index is 36.4 kg/m. If this is out of the aformentioned range listed, please consider follow up with your Primary Care Provider.   ________________________________________________________  The [facility] providers would like to encourage you to use MYCHART to communicate with providers for non-urgent requests or questions.  Due to long hold times on the telephone, sending your provider a message by [facility] may be a faster and more efficient way to get a response.  Please allow 48 business hours for a response.  Please remember that this is for non-urgent requests.  _______________________________________________________

## 2023-02-22 NOTE — Progress Notes (Signed)
 Subjective:    Patient ID: Caleb Baker, male    DOB: 05/22/1975, 48 y.o.   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  He is here alone today and was last seen in the office in July 2023.  He feels well overall with no abdominal pain, swelling, or bleeding. His last MRI, conducted approximately six months ago, showed stable findings with an enlarged spleen and no concerning liver lesions or nodules. He has a history of low platelet counts related to his liver condition.  He is currently taking carvedilol  at a dose of 3.125 mg twice a day for his esophageal varices, which was started two years ago. He is also on lisinopril and pantoprazole , and he takes vitamin E for liver support.  No abdominal swelling, leg swelling, or bleeding. He reports no alcohol consumption and states that his work is going well. He has noticed some weight gain but attributes it to wearing heavy boots. He does not regularly weigh himself at home. He plans to resume his exercise routine once the weather warms up.   Review of Systems As per HPI, otherwise negative  Current Medications, Allergies, Past Medical History, Past Surgical History, Family History and Social History were reviewed in [employer] record.    Objective:   Physical Exam BP 104/60 (BP Location: Left Arm, Patient Position: Sitting)   Pulse 88   Ht 6' 0.5 (1.842 m)   Wt 272 lb 2 oz (123.4 kg)   BMI 36.40 kg/m  Gen: awake, alert, NAD HEENT: anicteric  Abd: soft, NT/ND, +BS throughout Ext: no c/c/e Neuro: nonfocal     Latest Ref Rng & Units 07/13/2021   12:18 PM 12/29/2020   10:22 AM 12/11/2019    6:20 AM  CBC  WBC 4.0 - 10.5 K/uL 4.1  3.2  3.7   Hemoglobin 13.0 - 17.0 g/dL 85.7  85.6  84.4   Hematocrit 39.0 - 52.0 % 40.8  41.0   44.2   Platelets 150.0 - 400.0 K/uL 65.0 Repeated and verified X2.  66.0  73    CMP     Component Value Date/Time   NA 138 07/13/2021 1218   K 4.4 07/13/2021 1218   CL 104 07/13/2021 1218   CO2 27 07/13/2021 1218   GLUCOSE 170 (H) 07/13/2021 1218   BUN 14 07/13/2021 1218   CREATININE 0.98 07/13/2021 1218   CALCIUM 8.9 07/13/2021 1218   PROT 6.6 07/13/2021 1218   ALBUMIN 4.0 07/13/2021 1218   AST 20 07/13/2021 1218   ALT 17 07/13/2021 1218   ALKPHOS 114 07/13/2021 1218   BILITOT 2.7 (H) 07/13/2021 1218   GFR 92.23 07/13/2021 1218   Lab Results  Component Value Date   INR 1.3 (H) 07/13/2021   INR 1.2 (H) 12/29/2020   INR 1.2 12/11/2019  MRI ABDOMEN WITHOUT AND WITH CONTRAST   TECHNIQUE: Multiplanar multisequence MR imaging of the abdomen was performed both before and after the administration of intravenous contrast.   CONTRAST:  10mL GADAVIST  GADOBUTROL  1 MMOL/ML IV SOLN   COMPARISON:  MRI 06/29/2021 and older.  Ultrasound 12/29/2020   FINDINGS: Lower chest: No pleural effusion. Varices identified along the lower esophagus. Small right-sided pericardial cysts identified measuring 16 mm. Unchanged from previous.   Hepatobiliary: No enhancing liver lesion. Patent portal vein. Diameter  of the main portal vein at the liver hilum measures 12 mm. Gallbladder is nondilated. No biliary ductal dilatation. Hepatic parenchyma is grossly preserved. Slightly small appearing liver. Slight areas of fatty infiltration. No restricted diffusion.   Pancreas: No mass, inflammatory changes, or other parenchymal abnormality identified.   Spleen: Preserved splenic enhancement but the spleen is enlarged with a cephalocaudal length approaching 21 cm. Previously 21.6 cm.   Adrenals/Urinary Tract: Adrenal glands are preserved. No enhancing renal mass or collecting system dilatation. Bosniak 1 right-sided lower pole renal cysts identified. Few Bosniak 2 foci. No specific imaging follow-up  of these benign cysts.   Stomach/Bowel: Visualized portions within the abdomen are unremarkable.   Vascular/Lymphatic: Normal caliber aorta and IVC. Once again there are several varices identified throughout the upper abdomen including along the stomach, lower esophagus in the left upper quadrant. Some component of the splenorenal shunt.   Other:  No ascites.   Musculoskeletal: Slight curvature of the spine.   IMPRESSION: Small liver.  No enhancing liver mass.   Splenomegaly up to 20 cm once again identified with multiple varices including a small splenorenal shunt. The varices also extend along the lower esophagus and along the margin of the stomach.   No ascites.     Electronically Signed   By: Ranell Bring M.D.   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  There is no evidence for jaundice or ascites.  He has not had encephalopathy or bleeding.  His resting heart rate is not at goal and so I would like to increase his carvedilol  assuming he can tolerate this from a symptom standpoint.  If blood pressure does not allow I would recommend we reduce lisinopril from 10 to 5 mg daily to allow for carvedilol  increase -- Increase carvedilol  to 6.25 mg twice daily; target resting heart rate as tolerated by symptoms and blood pressure of around 60 bpm; he will not need repeat EGD assuming no decompensation and he remains on beta-blockade -- HCC screening up-to-date with MRI in July 2024; ultrasound recommended July 2025 every 6 months x 2 and then MRI again 6 months later -- Continue low-sodium diet and strict alcohol  avoidance -- Continue vitamin E 800 IU daily  2.  Portal gastropathy --carvedilol  as above and daily PPI  3.  History of positive TTG antibody --duodenal biopsy negative for celiac disease and thus he is not on a GFD  4.  History of colonic SSP --surveillance colonoscopy recommended October 2026  Office follow-up in 1 year  30 minutes total spent today including patient facing time, coordination of care, reviewing medical history/procedures/pertinent radiology studies, and documentation of the encounter.      Liver Disease with Splenomegaly and Thrombocytopenia Stable on recent MRI with no concerning liver spots or nodules. No symptoms of ascites or peripheral edema. 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 -Continue monitoring with regular imaging (ultrasound every 6 months, MRI every other year). -Check blood counts, INR, bilirubin, and kidney function today.  Esophageal Varices On carvedilol  for portal hypertension. Heart rate in the 80s, goal is 60s to low 70s for optimal reduction in portal pressures. -Increase carvedilol  to  6.25mg  twice daily (from 3.125mg  twice daily). -If side effects occur (lightheadedness, dizziness), consider reducing lisinopril dose.  Weight Management Patient reports weight gain, but remains active. -Encourage regular weight monitoring at home. -Continue exercise routine as weather permits.  Medication Management On carvedilol , lisinopril, pantoprazole , and vitamin E. -Continue all medications. -Consider dose adjustment of lisinopril if side effects occur with increased carvedilol  dose.

## 2023-02-24 ENCOUNTER — Other Ambulatory Visit: Payer: Self-pay

## 2023-02-24 ENCOUNTER — Encounter: Payer: Self-pay | Admitting: Internal Medicine

## 2023-02-24 DIAGNOSIS — K7460 Unspecified cirrhosis of liver: Secondary | ICD-10-CM

## 2023-04-22 ENCOUNTER — Encounter (HOSPITAL_COMMUNITY): Payer: Self-pay

## 2023-04-22 ENCOUNTER — Other Ambulatory Visit: Payer: Self-pay

## 2023-04-22 ENCOUNTER — Emergency Department (HOSPITAL_COMMUNITY)
Admission: EM | Admit: 2023-04-22 | Discharge: 2023-04-22 | Disposition: A | Discharge status: Home / Self Care | Attending: Emergency Medicine | Admitting: Emergency Medicine

## 2023-04-22 DIAGNOSIS — R600 Localized edema: Secondary | ICD-10-CM | POA: Insufficient documentation

## 2023-04-22 DIAGNOSIS — M7989 Other specified soft tissue disorders: Secondary | ICD-10-CM | POA: Diagnosis present

## 2023-04-22 MED ORDER — APIXABAN 5 MG PO TABS
10.0000 mg | ORAL_TABLET | Freq: Once | ORAL | Status: AC
  Administered 2023-04-22: 10 mg via ORAL
  Filled 2023-04-22: qty 2

## 2023-04-22 NOTE — Discharge Instructions (Addendum)
 The cause of your pain behind your knee is unclear at this time, we ordered an ultrasound outpatient, for you to get done, please return to the [facility], ER, to get this repeated.  They will be able to do this outpatient and should not charge her for another visit.  Return to the ER for shortness of breath, or chest pain.  We gave you 1 tablet, of Eliquis here today in case you are positive for DVT tomorrow.  However if you are positive for DVT tomorrow, you will need to get a Eliquis prescription

## 2023-04-22 NOTE — ED Triage Notes (Signed)
 Pt arrived reporting LLE pain for a few days. Patient left LLE red, swollen, warm to touch. Denies recent travel or injury. Also denies shob, chest pain or any other symptoms. Sent by MD to rule out DVT. Palpable pedal pulse

## 2023-04-22 NOTE — ED Provider Notes (Signed)
 [place] [facility]ERG[facility] DEPARTMENT AT [facility] Provider Note   CSN: [id number] Arrival date & time: 04/22/23  1842     History  Chief Complaint  Patient presents with   Leg Swelling   Leg Pain    Caleb Baker is a 48 y.o. male, history of cirrhosis, who presents to the ED secondary to left lower extremities pain, has been going on for the last 2 weeks.  He states for the last 2 weeks, he has had back pain, that developed left calf pain.  He states that is gotten worse, and the leg is swollen now, but his back pain is better.  He was sent here by his primary care doctor, for a DVT study.  He denies any trauma to the knee.  States his leg is little bit more swollen than the other one. Denies any trauma, travel, or other provoking factors. No hx of DVT or PE. Denies chest pain or SOB.   Home Medications Prior to Admission medications   Medication Sig Start Date End Date Taking? Authorizing Provider  albuterol (VENTOLIN HFA) 108 (90 Base) MCG/ACT inhaler Inhale 2 puffs into the lungs every 6 (six) hours as needed for wheezing or shortness of breath.    [provider]  carvedilol (COREG) 6.25 MG tablet Take 1 tablet (6.25 mg total) by mouth 2 (two) times daily with a meal. 02/22/23   Pyrtle, Carie Caddy, MD  lisinopril (PRINIVIL,ZESTRIL) 10 MG tablet Take 10 mg by mouth daily.    [provider]  metFORMIN (GLUCOPHAGE-XR) 500 MG 24 hr tablet Take 500 mg by mouth 2 (two) times daily.    [provider]  naproxen sodium (ALEVE) 220 MG tablet Take 220 mg by mouth daily as needed (pain).    [provider]  pantoprazole (PROTONIX) 40 MG tablet Take 1 tablet (40 mg total) by mouth daily. 02/18/22   Pyrtle, Carie Caddy, MD  sildenafil (VIAGRA) 100 MG tablet as needed. 11/27/19   [provider]  vitamin E 180 MG (400 UNITS) capsule Take 400 Units by mouth in the morning and at bedtime.    [provider]      Allergies    Prednisone and  Amoxicill-clarithro-lansopraz    Review of Systems   Review of Systems  Cardiovascular:  Positive for leg swelling. Negative for chest pain.    Physical Exam Updated Vital Signs BP (!) 166/94 (BP Location: Left Arm)   Pulse 74   Temp 98.5 F (36.9 C) (Oral)   Resp 16   Ht 6\' 1"  (1.854 m)   Wt 127 kg   SpO2 100%   BMI 36.94 kg/m  Physical Exam Vitals and nursing note reviewed.  Constitutional:      General: He is not in acute distress.    Appearance: He is well-developed.  HENT:     Head: Normocephalic and atraumatic.  Eyes:     Conjunctiva/sclera: Conjunctivae normal.  Cardiovascular:     Rate and Rhythm: Normal rate and regular rhythm.     Heart sounds: No murmur heard.    Comments: Tenderness to palpation, as the left popliteal region.  No cord palpated.  No mass present.  Mild erythema, but no evidence of any kind of cellulitis.  Positive dorsalis pedis pulse. Pulmonary:     Effort: Pulmonary effort is normal. No respiratory distress.     Breath sounds: Normal breath sounds.  Abdominal:     Palpations: Abdomen is soft.  Tenderness: There is no abdominal tenderness.  Musculoskeletal:        General: No swelling.     Cervical back: Neck supple.     Right lower leg: 1+ Edema present.     Left lower leg: 2+ Edema present.  Skin:    General: Skin is warm and dry.     Capillary Refill: Capillary refill takes less than 2 seconds.  Neurological:     Mental Status: He is alert.  Psychiatric:        Mood and Affect: Mood normal.     ED Results / Procedures / Treatments   Labs (all labs ordered are listed, but only abnormal results are displayed) Labs Reviewed - No data to display  EKG None  Radiology No results found.  Procedures Procedures    Medications Ordered in ED Medications  apixaban (ELIQUIS) tablet 10 mg (10 mg Oral Given 04/22/23 2000)    ED Course/ Medical Decision Making/ A&P                                 Medical Decision  Making Patient is a 48 year old male, with left calf pain, it has been going on for the last couple weeks.  The leg is swollen, but there is a good pulse.  There is mild erythema, that is minimal.  Has also not had any kind of trauma to the knee, thus no x-ray necessary.  Discussed with him and his wife at length, of possible DVT, and that we currently do not have a DVT tech, here as it is after 7 PM.  We have agreed to give him a dose of Eliquis, starting dose, prior to leaving, and have him come back, tomorrow for an ultrasound.  They are in agreement with the plan.  He currently denies any chest pain or shortness of breath.  Is overall well-appearing.  Discharged home with strict return precautions  Risk Prescription drug management.    Final Clinical Impression(s) / ED Diagnoses Final diagnoses:  Leg edema    Rx / DC Orders ED Discharge Orders          Ordered    LE Venous  Status:  Canceled       Comments: IMPORTANT PATIENT INSTRUCTIONS:  You have been scheduled for an Outpatient Vascular Study at [facility].    If tomorrow is a Saturday, Sunday or holiday, please go to the [facility] Emergency Department Registration Desk at 11 am tomorrow morning and tell them you are there for a vascular study.   If tomorrow is a weekday (Monday-Friday), please go to [facility] Entrance C, [facility] Clinic Registration at 11 am and tell them you are there for a vascular study.   04/22/23 1940    LE Venous  Status:  Canceled       Comments: IMPORTANT PATIENT INSTRUCTIONS:  You have been scheduled for an Outpatient Vascular Study at [facility].    If tomorrow is a Saturday, Sunday or holiday, please go to the [facility] Emergency Department Registration Desk at 11 am tomorrow morning and tell them you are there for a vascular study.   If tomorrow is a weekday (Monday-Friday), please go to [facility] Entrance C, [facility] Clinic  Registration at 11 am and tell them you are there for a vascular study.   04/22/23 1941    LE  Venous       Comments: IMPORTANT PATIENT INSTRUCTIONS:  You have been scheduled for an Outpatient Vascular Study at [facility].    If tomorrow is a Saturday, Sunday or holiday, please go to the [facility] long Emergency Department Registration Desk at 11 am tomorrow morning and tell them you are there for a vascular study.   04/22/23 1948              Pete Pelt, PA 04/22/23 2006    Glyn Ade, MD 04/23/23 1300

## 2023-04-23 ENCOUNTER — Ambulatory Visit (HOSPITAL_COMMUNITY)
Admission: RE | Admit: 2023-04-23 | Discharge: 2023-04-23 | Disposition: A | Discharge status: Home / Self Care | Source: Ambulatory Visit | Attending: Emergency Medicine | Admitting: Emergency Medicine

## 2023-04-23 DIAGNOSIS — R600 Localized edema: Secondary | ICD-10-CM | POA: Diagnosis not present

## 2023-04-23 DIAGNOSIS — M79605 Pain in left leg: Secondary | ICD-10-CM | POA: Insufficient documentation

## 2023-04-23 DIAGNOSIS — M7989 Other specified soft tissue disorders: Secondary | ICD-10-CM | POA: Diagnosis not present

## 2023-05-03 ENCOUNTER — Other Ambulatory Visit (INDEPENDENT_AMBULATORY_CARE_PROVIDER_SITE_OTHER)

## 2023-05-03 DIAGNOSIS — K7460 Unspecified cirrhosis of liver: Secondary | ICD-10-CM | POA: Diagnosis not present

## 2023-05-03 DIAGNOSIS — K7581 Nonalcoholic steatohepatitis (NASH): Secondary | ICD-10-CM | POA: Diagnosis not present

## 2023-05-03 LAB — CBC WITH DIFFERENTIAL/PLATELET
Basophils Absolute: 0 10*3/uL (ref 0.0–0.1)
Basophils Relative: 1 % (ref 0.0–3.0)
Eosinophils Absolute: 0.2 10*3/uL (ref 0.0–0.7)
Eosinophils Relative: 7.2 % — ABNORMAL HIGH (ref 0.0–5.0)
HCT: 38 % — ABNORMAL LOW (ref 39.0–52.0)
Hemoglobin: 13.2 g/dL (ref 13.0–17.0)
Lymphocytes Relative: 26.8 % (ref 12.0–46.0)
Lymphs Abs: 0.9 10*3/uL (ref 0.7–4.0)
MCHC: 34.8 g/dL (ref 30.0–36.0)
MCV: 92.3 fl (ref 78.0–100.0)
Monocytes Absolute: 0.3 10*3/uL (ref 0.1–1.0)
Monocytes Relative: 9.5 % (ref 3.0–12.0)
Neutro Abs: 1.8 10*3/uL (ref 1.4–7.7)
Neutrophils Relative %: 55.5 % (ref 43.0–77.0)
Platelets: 69 10*3/uL — ABNORMAL LOW (ref 150.0–400.0)
RBC: 4.12 Mil/uL — ABNORMAL LOW (ref 4.22–5.81)
RDW: 14.8 % (ref 11.5–15.5)
WBC: 3.3 10*3/uL — ABNORMAL LOW (ref 4.0–10.5)

## 2023-05-03 LAB — COMPREHENSIVE METABOLIC PANEL WITH GFR
ALT: 17 U/L (ref 0–53)
AST: 22 U/L (ref 0–37)
Albumin: 3.9 g/dL (ref 3.5–5.2)
Alkaline Phosphatase: 132 U/L — ABNORMAL HIGH (ref 39–117)
BUN: 16 mg/dL (ref 6–23)
CO2: 26 meq/L (ref 19–32)
Calcium: 9 mg/dL (ref 8.4–10.5)
Chloride: 106 meq/L (ref 96–112)
Creatinine, Ser: 1 mg/dL (ref 0.40–1.50)
GFR: 88.89 mL/min (ref >60.00)
Glucose, Bld: 226 mg/dL — ABNORMAL HIGH (ref 70–99)
Potassium: 4.1 meq/L (ref 3.5–5.1)
Sodium: 137 meq/L (ref 135–145)
Total Bilirubin: 3 mg/dL — ABNORMAL HIGH (ref 0.2–1.2)
Total Protein: 6.3 g/dL (ref 6.0–8.3)

## 2023-05-03 LAB — PROTIME-INR
INR: 1.4 ratio — ABNORMAL HIGH (ref 0.8–1.0)
Prothrombin Time: 14.6 s — ABNORMAL HIGH (ref 9.6–13.1)

## 2023-05-18 ENCOUNTER — Encounter: Payer: Self-pay | Admitting: Internal Medicine

## 2023-05-18 ENCOUNTER — Other Ambulatory Visit: Payer: Self-pay | Admitting: *Deleted

## 2023-05-18 DIAGNOSIS — K766 Portal hypertension: Secondary | ICD-10-CM

## 2023-05-18 DIAGNOSIS — K7460 Unspecified cirrhosis of liver: Secondary | ICD-10-CM

## 2023-07-13 ENCOUNTER — Telehealth: Payer: Self-pay | Admitting: Internal Medicine

## 2023-07-13 ENCOUNTER — Other Ambulatory Visit: Payer: Self-pay

## 2023-07-13 DIAGNOSIS — K7460 Unspecified cirrhosis of liver: Secondary | ICD-10-CM

## 2023-07-13 DIAGNOSIS — K766 Portal hypertension: Secondary | ICD-10-CM

## 2023-07-13 MED ORDER — CARVEDILOL 6.25 MG PO TABS: 6.2500 mg | ORAL_TABLET | Freq: Two times a day (BID) | ORAL | 3 refills | Status: AC

## 2023-07-13 NOTE — Telephone Encounter (Signed)
 Patient called and stated that he is needing a refill for his Carvedilol  6.25 MG and for it [facility]. Patient also stated that he is needing to schedule an appointment for either a MRI or an Ultrasound, patient is not sure which one. Patient is requesting a call back. Please advise.

## 2023-07-13 NOTE — Telephone Encounter (Signed)
 Prescription for carvedilol  sent [facility] as requested, US  order in epic. Pt knows this is due in July, he was given the phone number [phone] to schedule the appt on a day that works for him. Pt verbalized understanding.

## 2023-07-18 ENCOUNTER — Telehealth: Payer: Self-pay | Admitting: *Deleted

## 2023-07-18 NOTE — Telephone Encounter (Signed)
 Patient is scheduled for ultrasound 08/05/23.

## 2023-07-18 NOTE — Telephone Encounter (Signed)
===  View-only below this line=== ----- Message ----- From: Albertus Gordy HERO, MD Sent: 07/18/2023   2:08 PM EDT To: Naomie LOISE Sharps, RN  US  now and then in 6 months and MRI 6 months after 2nd US  JMP ----- Message ----- From: Sharps Naomie LOISE, RN Sent: 07/11/2023  10:51 AM EDT To: Gordy HERO Albertus, MD  Can you clarify whether you would like patient to have abdominal ultrasound vs MRI liver in July? In one note, we told patient he need ultrasound, in another note we placed reminders for MRI liver 07/2023. ----- Message ----- From: Sharps Naomie LOISE, RN Sent: 07/11/2023  12:00 AM EDT To: Naomie LOISE Sharps, RN  Needs abd u/s around 07/2023 for [facility] screen; pyrtle pt (See 05/03/23 lab result note for info)

## 2023-08-05 ENCOUNTER — Ambulatory Visit (HOSPITAL_COMMUNITY)
Admission: RE | Admit: 2023-08-05 | Discharge: 2023-08-05 | Disposition: A | Discharge status: Home / Self Care | Source: Ambulatory Visit | Attending: Internal Medicine | Admitting: Internal Medicine

## 2023-08-05 DIAGNOSIS — K7460 Unspecified cirrhosis of liver: Secondary | ICD-10-CM | POA: Diagnosis present

## 2023-08-05 DIAGNOSIS — K766 Portal hypertension: Secondary | ICD-10-CM | POA: Insufficient documentation

## 2023-08-05 DIAGNOSIS — K7581 Nonalcoholic steatohepatitis (NASH): Secondary | ICD-10-CM | POA: Diagnosis present

## 2023-08-11 ENCOUNTER — Ambulatory Visit: Payer: Self-pay | Admitting: Internal Medicine

## 2023-08-19 ENCOUNTER — Other Ambulatory Visit (HOSPITAL_COMMUNITY): Payer: Self-pay | Admitting: Sports Medicine

## 2023-08-19 DIAGNOSIS — M25512 Pain in left shoulder: Secondary | ICD-10-CM

## 2023-08-25 ENCOUNTER — Ambulatory Visit (HOSPITAL_COMMUNITY)
Admission: RE | Admit: 2023-08-25 | Discharge: 2023-08-25 | Disposition: A | Discharge status: Home / Self Care | Source: Ambulatory Visit | Attending: Sports Medicine | Admitting: Sports Medicine

## 2023-08-25 ENCOUNTER — Encounter (HOSPITAL_COMMUNITY): Payer: Self-pay

## 2023-08-25 DIAGNOSIS — M25512 Pain in left shoulder: Secondary | ICD-10-CM

## 2023-08-25 MED ORDER — LIDOCAINE HCL (PF) 1 % IJ SOLN
5.0000 mL | Freq: Once | INTRAMUSCULAR | Status: AC
  Administered 2023-08-25: 10 mL

## 2023-08-25 MED ORDER — SODIUM CHLORIDE (PF) 0.9% IJ SOLUTION - NO CHARGE
10.0000 mL | Freq: Once | INTRAMUSCULAR | Status: AC
  Administered 2023-08-25: 10 mL
  Filled 2023-08-25: qty 10

## 2023-08-25 MED ORDER — GADOBUTROL 1 MMOL/ML IV SOLN
0.0500 mL | Freq: Once | INTRAVENOUS | Status: AC | PRN
  Administered 2023-08-25: 0.05 mL

## 2023-08-25 MED ORDER — IOHEXOL 180 MG/ML  SOLN
10.0000 mL | Freq: Once | INTRAMUSCULAR | Status: AC | PRN
  Administered 2023-08-25: 10 mL via INTRA_ARTICULAR

## 2023-08-31 ENCOUNTER — Encounter (HOSPITAL_COMMUNITY): Payer: Self-pay | Admitting: Sports Medicine

## 2023-09-06 ENCOUNTER — Other Ambulatory Visit (HOSPITAL_COMMUNITY): Payer: Self-pay | Admitting: Sports Medicine

## 2023-09-06 DIAGNOSIS — M7502 Adhesive capsulitis of left shoulder: Secondary | ICD-10-CM

## 2023-09-06 DIAGNOSIS — M25512 Pain in left shoulder: Secondary | ICD-10-CM

## 2023-09-21 ENCOUNTER — Ambulatory Visit (HOSPITAL_COMMUNITY)
Admission: RE | Admit: 2023-09-21 | Discharge: 2023-09-21 | Disposition: A | Discharge status: Home / Self Care | Source: Ambulatory Visit | Attending: Sports Medicine | Admitting: Sports Medicine

## 2023-09-21 DIAGNOSIS — M25512 Pain in left shoulder: Secondary | ICD-10-CM | POA: Diagnosis present

## 2023-09-21 DIAGNOSIS — M7502 Adhesive capsulitis of left shoulder: Secondary | ICD-10-CM | POA: Insufficient documentation

## 2023-10-18 ENCOUNTER — Telehealth: Payer: Self-pay | Admitting: Internal Medicine

## 2023-10-18 NOTE — Telephone Encounter (Signed)
 Inbound call from patient stating he would like to know if paper work was faxed out for him to have clearance to have surgery. Patient stated he was waiting for clearance from Dr.Pyrtle and his PCP.  Please advise  Thank you

## 2023-10-18 NOTE — Telephone Encounter (Signed)
 Dottie have you seen any papers for surgical clearance on this pt?

## 2023-10-18 NOTE — Telephone Encounter (Signed)
 There is a form in Dr [employer] desk inbasket that has not yet been filled out.  I have contacted patient to advise that we do have orthopedic clearance form request and will fax this to orthopedic office once Dr Albertus reviews and signs (if appropriate).  Patient verbalizes understanding.

## 2023-10-24 ENCOUNTER — Inpatient Hospital Stay: Admitting: Hematology and Oncology

## 2023-10-24 ENCOUNTER — Inpatient Hospital Stay (HOSPITAL_BASED_OUTPATIENT_CLINIC_OR_DEPARTMENT_OTHER): Admitting: Hematology and Oncology

## 2023-10-24 ENCOUNTER — Inpatient Hospital Stay: Attending: Hematology and Oncology

## 2023-10-24 VITALS — BP 130/79 | HR 77 | Temp 98.5°F | Resp 18 | Ht 73.0 in | Wt 268.7 lb

## 2023-10-24 DIAGNOSIS — D696 Thrombocytopenia, unspecified: Secondary | ICD-10-CM | POA: Insufficient documentation

## 2023-10-24 DIAGNOSIS — K7460 Unspecified cirrhosis of liver: Secondary | ICD-10-CM

## 2023-10-24 LAB — IMMATURE PLATELET FRACTION: Immature Platelet Fraction: 6.2 % (ref 1.2–8.6)

## 2023-10-24 LAB — CBC WITH DIFFERENTIAL/PLATELET
Abs Immature Granulocytes: 0.01 K/uL (ref 0.00–0.07)
Basophils Absolute: 0 K/uL (ref 0.0–0.1)
Basophils Relative: 1 %
Eosinophils Absolute: 0.2 K/uL (ref 0.0–0.5)
Eosinophils Relative: 5 %
HCT: 37.1 % — ABNORMAL LOW (ref 39.0–52.0)
Hemoglobin: 13.7 g/dL (ref 13.0–17.0)
Immature Granulocytes: 0 %
Lymphocytes Relative: 30 %
Lymphs Abs: 0.9 K/uL (ref 0.7–4.0)
MCH: 31.6 pg (ref 26.0–34.0)
MCHC: 36.9 g/dL — ABNORMAL HIGH (ref 30.0–36.0)
MCV: 85.5 fL (ref 80.0–100.0)
Monocytes Absolute: 0.3 K/uL (ref 0.1–1.0)
Monocytes Relative: 9 %
Neutro Abs: 1.7 K/uL (ref 1.7–7.7)
Neutrophils Relative %: 55 %
Platelets: 54 K/uL — ABNORMAL LOW (ref 150–400)
RBC: 4.34 MIL/uL (ref 4.22–5.81)
RDW: 13.6 % (ref 11.5–15.5)
WBC: 3.1 K/uL — ABNORMAL LOW (ref 4.0–10.5)
nRBC: 0 % (ref 0.0–0.2)

## 2023-10-24 NOTE — Progress Notes (Signed)
 [facility] CONSULT NOTE  Patient Care Team: Arloa Elsie SAUNDERS, MD as PCP - General (Family Medicine)  CHIEF COMPLAINTS/PURPOSE OF CONSULTATION:  Thrombocytopenia  HISTORY OF PRESENTING ILLNESS:    History of Present Illness Caleb Baker is a 48 year old male with chronic thrombocytopenia who presents for pre-surgical clearance. He was referred by Dr. Arloa for evaluation prior to surgery.  Chronic thrombocytopenia is present with platelet counts typically ranging from 60,000 to 70,000, occasionally dropping to 52,000. An ultrasound in 2021 indicated possible liver issues related to ITP or sequestration. The most recent platelet count is 55,000. He has not undergone platelet transfusions. He is scheduled for rotator cuff surgery due to a significant tear in his shoulder.    I reviewed her records extensively and collaborated the history with the patient.   MEDICAL HISTORY:  Past Medical History:  Diagnosis Date   Asthma    Cirrhosis (HCC)    COVID-19 12/2019   Diabetes (HCC)    Esophageal varices (HCC)    Hypertension    Portal venous hypertension (HCC)    Renal cyst    Thrombocytopenia     SURGICAL HISTORY: Past Surgical History:  Procedure Laterality Date   COLONOSCOPY     HERNIA REPAIR     UPPER GASTROINTESTINAL ENDOSCOPY     WISDOM TOOTH EXTRACTION      SOCIAL HISTORY: Social History   Socioeconomic History   Marital status: Married    Spouse name: Not on file   Number of children: Not on file   Years of education: Not on file   Highest education level: Not on file  Occupational History   Not on file  Tobacco Use   Smoking status: Never   Smokeless tobacco: Former    Types: Snuff    Quit date: 01/2017  Vaping Use   Vaping status: Never Used  Substance and Sexual Activity   Alcohol use: Not Currently    Comment: social; hasn't had a drink since May 2021   Drug use: Never   Sexual activity: Not Currently  Other Topics Concern    Not on file  Social History Narrative   Not on file   Social Drivers of Health   Financial Resource Strain: Not on file  Food Insecurity: No Food Insecurity (10/24/2023)   Hunger Vital Sign    Worried About Running Out of Food in the Last Year: Never true    Ran Out of Food in the Last Year: Never true  Transportation Needs: No Transportation Needs (10/24/2023)   PRAPARE - Administrator, Civil Service (Medical): No    Lack of Transportation (Non-Medical): No  Physical Activity: Not on file  Stress: Not on file  Social Connections: Not on file  Intimate Partner Violence: Not At Risk (10/24/2023)   Humiliation, Afraid, Rape, and Kick questionnaire    Fear of Current or Ex-Partner: No    Emotionally Abused: No    Physically Abused: No    Sexually Abused: No    FAMILY HISTORY: Family History  Problem Relation Age of Onset   Liver disease Father    Liver disease Paternal Grandmother    Colon cancer Neg Hx    Stomach cancer Neg Hx    Pancreatic cancer Neg Hx    Esophageal cancer Neg Hx     ALLERGIES:  is allergic to prednisone and amoxicill-clarithro-lansopraz.  MEDICATIONS:  Current Outpatient Medications  Medication Sig Dispense Refill   carvedilol  (COREG ) 6.25 MG tablet  Take 1 tablet (6.25 mg total) by mouth 2 (two) times daily with a meal. 180 tablet 3   albuterol (VENTOLIN HFA) 108 (90 Base) MCG/ACT inhaler Inhale 2 puffs into the lungs every 6 (six) hours as needed for wheezing or shortness of breath.     carvedilol  (COREG ) 6.25 MG tablet Take 1 tablet (6.25 mg total) by mouth 2 (two) times daily with a meal.     lisinopril (PRINIVIL,ZESTRIL) 10 MG tablet Take 10 mg by mouth daily.     metFORMIN (GLUCOPHAGE-XR) 500 MG 24 hr tablet Take 500 mg by mouth 2 (two) times daily.     naproxen  sodium (ALEVE ) 220 MG tablet Take 220 mg by mouth daily as needed (pain).     pantoprazole  (PROTONIX ) 40 MG tablet Take 1 tablet (40 mg total) by mouth daily. 90 tablet 3    sildenafil (VIAGRA) 100 MG tablet as needed.     vitamin E 180 MG (400 UNITS) capsule Take 400 Units by mouth in the morning and at bedtime.     Current Facility-Administered Medications  Medication Dose Route Frequency Provider Last Rate Last Admin   0.9 %  sodium chloride  infusion  500 mL Intravenous Once Pyrtle, Gordy HERO, MD        REVIEW OF SYSTEMS:   Constitutional: Denies fevers, chills or abnormal night sweats All other systems were reviewed with the patient and are negative.  PHYSICAL EXAMINATION: ECOG PERFORMANCE STATUS: 1 - Symptomatic but completely ambulatory  Vitals:   10/24/23 1229  BP: 130/79  Pulse: 77  Resp: 18  Temp: 98.5 F (36.9 C)  SpO2: 100%   Filed Weights   10/24/23 1229  Weight: 268 lb 11.2 oz (121.9 kg)    GENERAL:alert, no distress and comfortable  LABORATORY DATA:  I have reviewed the data as listed Lab Results  Component Value Date   WBC 3.1 (L) 10/24/2023   HGB 13.7 10/24/2023   HCT 37.1 (L) 10/24/2023   MCV 85.5 10/24/2023   PLT 54 (L) 10/24/2023   Lab Results  Component Value Date   NA 137 05/03/2023   K 4.1 05/03/2023   CL 106 05/03/2023   CO2 26 05/03/2023    RADIOGRAPHIC STUDIES: I have personally reviewed the radiological reports and agreed with the findings in the report.  ASSESSMENT AND PLAN:  Assessment & Plan Thrombocytopenia in the setting of chronic liver disease Chronic thrombocytopenia with platelet counts usually in the 60s-70s, recently 55.  Previous platelet count is 54.   Abdominal ultrasound shows cirrhotic liver morphology. 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 - Administer two bags of platelets pre-surgery to increase count to ~100. - Recheck platelet count 3-4 days post-surgery for further intervention.   Right rotator cuff tear (preoperative hematology clearance) Severe tear with 80-90% damage. Previous treatments ineffective. Surgery planned pending hematology clearance due to  thrombocytopenia. - Coordinate with Dr. Cristy at [facility] for surgery scheduling.  I sent a message to Dr. Cristy  Follow-up 4 days after surgery   All questions were answered. The patient knows to call the clinic with any problems, questions or concerns.    Viinay K [name], MD 10/24/23

## 2023-10-25 ENCOUNTER — Encounter: Payer: Self-pay | Admitting: Family Medicine

## 2023-10-27 ENCOUNTER — Other Ambulatory Visit: Payer: Self-pay | Admitting: *Deleted

## 2023-10-27 DIAGNOSIS — D696 Thrombocytopenia, unspecified: Secondary | ICD-10-CM

## 2023-10-27 NOTE — Telephone Encounter (Signed)
 Done last week.  Alan picked it up. JMP

## 2023-10-27 NOTE — Telephone Encounter (Signed)
 The preoperative risk assessment form has been faxed to [facility] on 10/19/23 and formed is scanned under media. Informed patient the form has already been faxed on 10/19/23 and we received a confirmation. Patient states the orthopedic office reports never receiving the form. Offered to fax form again to fax number on form. Preoperative risk assessment form faxed again with another confirmation.

## 2023-10-27 NOTE — Progress Notes (Signed)
 Received call from pt stating he is scheduled for surgery Monday 12/02/23.  Per MD pt needing 2 units of plts to be given prior to surgery.  Pt states Dr. Cristy would like for plts to be re-checked after infusion to ensure plts are high enough to perform surgery.  RN contacted Dr. Henri office [phone] ext [phone]) and left a voice mail stating, pt surgery will need to be moved to be Wednesday - Friday to allow for pt to come into office on a Monday for plt transfusion and Tuesday for plt re-check and labs be faxed to their office.

## 2023-10-27 NOTE — Telephone Encounter (Signed)
Patient calling in regards to previous note. Please advise.

## 2023-10-31 ENCOUNTER — Telehealth: Payer: Self-pay

## 2023-10-31 NOTE — Telephone Encounter (Signed)
 Reached out to Dr. Raguel office regarding updating surgery date to accommodate platelet transfusion and lab recheck. Office reached out to patient to update the surgery date to Wednesday, 11/19. Patient confirmed appointment date and time and is aware that a scheduling message has been sent to schedule him for a platelet transfusion on 11/17 and lab re-check on 11/18.  Patient verbalized an understanding of the information. Fax confirmation received for updated preoperative clearance.

## 2023-11-08 ENCOUNTER — Telehealth: Payer: Self-pay | Admitting: *Deleted

## 2023-11-08 NOTE — Telephone Encounter (Signed)
 Received call from pt stating surgery has been pushed out to 12/14/23 and future appts need to be adjusted to be 12/12/23 for lab and plts, 12/13/23 for plt lab recheck, and 12/19/23 for lab and MD f/u.  Message sent to scheduling team.

## 2023-11-15 ENCOUNTER — Encounter: Payer: Self-pay | Admitting: *Deleted

## 2023-12-05 ENCOUNTER — Inpatient Hospital Stay

## 2023-12-06 ENCOUNTER — Inpatient Hospital Stay

## 2023-12-06 NOTE — Progress Notes (Signed)
 Date of COVID positive in last 90 days:  PCP - Elsie Lesches, MD Cardiologist -  GI- Gordy Starch, MD  GI clearance in media tab dated 10/27/23 Clearance in media tab dated 12/01/23 Epic  Chest x-ray - N/A EKG - 10/18/23 with Margarete- requested results Stress Test - N/A ECHO - N/A Cardiac Cath - N/A Pacemaker/ICD device last checked:N/A Spinal Cord Stimulator:N/A  Bowel Prep - N/A  Sleep Study - N/A CPAP -   Fasting Blood Sugar - N/A Checks Blood Sugar _____ times a day  Last dose of GLP1 agonist-  N/A GLP1 instructions:  Do not take after     Last dose of SGLT-2 inhibitors-  N/A SGLT-2 instructions:  Do not take after     Blood Thinner Instructions: N/A Last dose:   Time: Aspirin Instructions:N/A Last Dose:  Activity level:  Can go up a flight of stairs and perform activities of daily living without stopping and without symptoms of chest pain or shortness of breath.  Able to exercise without symptoms  Unable to go up a flight of stairs without symptoms of     Anesthesia review: thrombocytopenia (got transfusion of 2 units plt before surgery), HTN, cirrhosis, DM2, asthma  Patient denies shortness of breath, fever, cough and chest pain at PAT appointment  Patient verbalized understanding of instructions that were given to them at the PAT appointment. Patient was also instructed that they will need to review over the PAT instructions again at home before surgery.

## 2023-12-06 NOTE — Patient Instructions (Signed)
 SURGICAL WAITING ROOM VISITATION  Patients having surgery or a procedure may have no more than 2 support people in the waiting area - these visitors may rotate.    Children under the age of 50 must have an adult with them who is not the patient.  Visitors with respiratory illnesses are discouraged from visiting and should remain at home.  If the patient needs to stay at the hospital during part of their recovery, the visitor guidelines for inpatient rooms apply. Pre-op nurse will coordinate an appropriate time for 1 support person to accompany patient in pre-op.  This support person may not rotate.    Please refer to the [facility] website for the visitor guidelines for Inpatients (after your surgery is over and you are in a regular room).    Your procedure is scheduled on: 12/14/23   Report to [facility] Main Entrance    Report to admitting at 7:05 AM   Call this number if you have problems the morning of surgery [phone]   Do not eat food or drink liquids :After Midnight.         If you have questions, please contact your surgeon's office.   FOLLOW BOWEL PREP AND ANY ADDITIONAL PRE OP INSTRUCTIONS YOU RECEIVED FROM YOUR SURGEON'S OFFICE!!!     Oral Hygiene is also important to reduce your risk of infection.                                    Remember - BRUSH YOUR TEETH THE MORNING OF SURGERY WITH YOUR REGULAR TOOTHPASTE  DENTURES WILL BE REMOVED PRIOR TO SURGERY PLEASE DO NOT APPLY Poly grip OR ADHESIVES!!!   Stop all vitamins and herbal supplements 7 days before surgery.   Take these medicines the morning of surgery with A SIP OF WATER: Inhaler, Carvedilol , Pantoprazole    DO NOT TAKE ANY ORAL DIABETIC MEDICATIONS DAY OF YOUR SURGERY  How to Manage Your Diabetes Before and After Surgery  Why is it important to control my blood sugar before and after surgery? Improving blood sugar levels before and after surgery helps healing and can limit problems. A way  of improving blood sugar control is eating a healthy diet by:  Eating less sugar and carbohydrates  Increasing activity/exercise  Talking with your doctor about reaching your blood sugar goals High blood sugars (greater than 180 mg/dL) can raise your risk of infections and slow your recovery, so you will need to focus on controlling your diabetes during the weeks before surgery. Make sure that the doctor who takes care of your diabetes knows about your planned surgery including the date and location.  How do I manage my blood sugar before surgery? Check your blood sugar at least 4 times a day, starting 2 days before surgery, to make sure that the level is not too high or low. Check your blood sugar the morning of your surgery when you wake up and every 2 hours until you get to the Short Stay unit. If your blood sugar is less than 70 mg/dL, you will need to treat for low blood sugar: Do not take insulin . Treat a low blood sugar (less than 70 mg/dL) with  cup of clear juice (cranberry or apple), 4 glucose tablets, OR glucose gel. Recheck blood sugar in 15 minutes after treatment (to make sure it is greater than 70 mg/dL). If your blood sugar is not greater than 70  mg/dL on recheck, call [phone] for further instructions. Report your blood sugar to the short stay nurse when you get to Short Stay.  If you are admitted to the hospital after surgery: Your blood sugar will be checked by the staff and you will probably be given insulin after surgery (instead of oral diabetes medicines) to make sure you have good blood sugar levels. The goal for blood sugar control after surgery is 80-180 mg/dL.   WHAT DO I DO ABOUT MY DIABETES MEDICATION?  Do not take oral diabetes medicines (pills) the morning of surgery.  Reviewed and Endorsed by [facility] Patient Education Committee, August 2015             You may not have any metal on your body including jewelry, and body piercing             Do not  wear lotions, powders, cologne, or deodorant              Men may shave face and neck.   Do not bring valuables to the hospital. [facility] IS NOT             RESPONSIBLE   FOR VALUABLES.   Contacts, glasses, dentures or bridgework may not be worn into surgery.  DO NOT BRING YOUR HOME MEDICATIONS TO THE HOSPITAL. PHARMACY WILL DISPENSE MEDICATIONS LISTED ON YOUR MEDICATION LIST TO YOU DURING YOUR ADMISSION IN THE HOSPITAL!    Patients discharged on the day of surgery will not be allowed to drive home.  Someone NEEDS to stay with you for the first 24 hours after anesthesia.              Please read over the following fact sheets you were given: IF YOU HAVE QUESTIONS ABOUT YOUR PRE-OP INSTRUCTIONS PLEASE CALL [phone] [address] [place].   If you received a COVID test during your pre-op visit  it is requested that you wear a mask when out in public, stay away from anyone that may not be feeling well and notify your surgeon if you develop symptoms. If you test positive for Covid or have been in contact with anyone that has tested positive in the last 10 days please notify you surgeon.    [place] - Preparing for Surgery Before surgery, you can play an important role.  Because skin is not sterile, your skin needs to be as free of germs as possible.  You can reduce the number of germs on your skin by washing with CHG (chlorahexidine gluconate) soap before surgery.  CHG is an antiseptic cleaner which kills germs and bonds with the skin to continue killing germs even after washing. Please DO NOT use if you have an allergy to CHG or antibacterial soaps.  If your skin becomes reddened/irritated stop using the CHG and inform your nurse when you arrive at Short Stay. Do not shave (including legs and underarms) for at least 48 hours prior to the first CHG shower.  You may shave your face/neck.  Please follow these instructions carefully:  1.  Shower with CHG Soap the night before surgery ONLY (DO NOT USE  THE SOAP THE MORNING OF SURGERY).  2.  If you choose to wash your hair, wash your hair first as usual with your normal  shampoo.  3.  After you shampoo, rinse your hair and body thoroughly to remove the shampoo.  4.  Use CHG as you would any other liquid soap.  You can apply chg directly to the skin and wash.  Gently with a scrungie or clean washcloth.  5.  Apply the CHG Soap to your body ONLY FROM THE NECK DOWN.   Do   not use on face/ open                           Wound or open sores. Avoid contact with eyes, ears mouth and   genitals (private parts).                       Wash face,  Genitals (private parts) with your normal soap.             6.  Wash thoroughly, paying special attention to the area where your    surgery  will be performed.  7.  Thoroughly rinse your body with warm water from the neck down.  8.  DO NOT shower/wash with your normal soap after using and rinsing off the CHG Soap.                9.  Pat yourself dry with a clean towel.            10.  Wear clean pajamas.            11.  Place clean sheets on your bed the night of your first shower and do not  sleep with pets. Day of Surgery : Do not apply any CHG, lotions/deodorants the morning of surgery.  Please wear clean clothes to the hospital/surgery center.  FAILURE TO FOLLOW THESE INSTRUCTIONS MAY RESULT IN THE CANCELLATION OF YOUR SURGERY  PATIENT SIGNATURE_________________________________  NURSE SIGNATURE__________________________________  ________________________________________________________________________

## 2023-12-07 ENCOUNTER — Other Ambulatory Visit: Payer: Self-pay

## 2023-12-07 ENCOUNTER — Encounter (HOSPITAL_COMMUNITY)
Admission: RE | Admit: 2023-12-07 | Discharge: 2023-12-07 | Disposition: A | Discharge status: Home / Self Care | Source: Ambulatory Visit | Attending: Orthopaedic Surgery | Admitting: Orthopaedic Surgery

## 2023-12-07 ENCOUNTER — Encounter (HOSPITAL_COMMUNITY): Payer: Self-pay

## 2023-12-07 VITALS — BP 115/87 | HR 76 | Temp 98.3°F | Resp 16 | Ht 73.0 in | Wt 268.7 lb

## 2023-12-07 DIAGNOSIS — Z01812 Encounter for preprocedural laboratory examination: Secondary | ICD-10-CM | POA: Insufficient documentation

## 2023-12-07 DIAGNOSIS — E119 Type 2 diabetes mellitus without complications: Secondary | ICD-10-CM | POA: Diagnosis not present

## 2023-12-07 DIAGNOSIS — K769 Liver disease, unspecified: Secondary | ICD-10-CM

## 2023-12-07 HISTORY — DX: Family history of other specified conditions: Z84.89

## 2023-12-07 LAB — CBC
HCT: 37.6 % — ABNORMAL LOW (ref 39.0–52.0)
Hemoglobin: 13.5 g/dL (ref 13.0–17.0)
MCH: 31.7 pg (ref 26.0–34.0)
MCHC: 35.9 g/dL (ref 30.0–36.0)
MCV: 88.3 fL (ref 80.0–100.0)
Platelets: 54 K/uL — ABNORMAL LOW (ref 150–400)
RBC: 4.26 MIL/uL (ref 4.22–5.81)
RDW: 13.9 % (ref 11.5–15.5)
WBC: 2.4 K/uL — ABNORMAL LOW (ref 4.0–10.5)
nRBC: 0 % (ref 0.0–0.2)

## 2023-12-07 LAB — COMPREHENSIVE METABOLIC PANEL WITH GFR
ALT: 22 U/L (ref 0–44)
AST: 31 U/L (ref 15–41)
Albumin: 4 g/dL (ref 3.5–5.0)
Alkaline Phosphatase: 179 U/L — ABNORMAL HIGH (ref 38–126)
Anion gap: 9 (ref 5–15)
BUN: 11 mg/dL (ref 6–20)
CO2: 24 mmol/L (ref 22–32)
Calcium: 9.1 mg/dL (ref 8.9–10.3)
Chloride: 107 mmol/L (ref 98–111)
Creatinine, Ser: 1.03 mg/dL (ref 0.61–1.24)
GFR, Estimated: >60 mL/min (ref >60)
Glucose, Bld: 155 mg/dL — ABNORMAL HIGH (ref 70–99)
Potassium: 4.8 mmol/L (ref 3.5–5.1)
Sodium: 140 mmol/L (ref 135–145)
Total Bilirubin: 2.3 mg/dL — ABNORMAL HIGH (ref 0.0–1.2)
Total Protein: 6.5 g/dL (ref 6.5–8.1)

## 2023-12-07 LAB — HEMOGLOBIN A1C
Hgb A1c MFr Bld: 7 % — ABNORMAL HIGH (ref 4.8–5.6)
Mean Plasma Glucose: 154.2 mg/dL

## 2023-12-07 LAB — GLUCOSE, CAPILLARY: Glucose-Capillary: 131 mg/dL — ABNORMAL HIGH (ref 70–99)

## 2023-12-07 NOTE — H&P (Signed)
 PREOPERATIVE H&P  Chief Complaint: Traumatic tear of left rotator cuff  HPI: Caleb Baker is a 48 y.o. male who is scheduled for, Procedure(s): ARTHROSCOPY, SHOULDER, WITH ROTATOR CUFF REPAIR.    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 He was building a house and had shoulder pain.  He has tried injections and physical therapy. He got worse with physical therapy. He has a history of diabetes and hypertension. He is not happy with his shoulder function.   Symptoms are rated as moderate to severe, and have been worsening.  This is significantly impairing activities of daily living.    Please see clinic note for further details on this patient's care.    He has elected for surgical management.   Past Medical History:  Diagnosis Date   Asthma    Cirrhosis (HCC)    COVID-19 12/2019   Diabetes (HCC)    Esophageal varices (HCC)    Family history of adverse reaction to anesthesia    mother PONV   Hypertension    Portal venous hypertension (HCC)    Renal cyst    Thrombocytopenia    Past Surgical History:  Procedure Laterality Date   COLONOSCOPY     HERNIA REPAIR     UPPER GASTROINTESTINAL ENDOSCOPY     WISDOM TOOTH EXTRACTION     Social History   Socioeconomic History   Marital status: Married    Spouse name: Not on file   Number of children: Not on file   Years of education: Not on file   Highest education level: Not on file  Occupational History   Not on file  Tobacco Use   Smoking status: Never   Smokeless tobacco: Former    Types: Snuff    Quit date: 01/2017  Vaping Use   Vaping status: Never Used  Substance and Sexual Activity   Alcohol use: Not Currently    Comment: social; hasn't had a drink since May 2021   Drug use: Never   Sexual activity: Not Currently  Other Topics Concern   Not on file  Social History Narrative   Not on file   Social Drivers of Health   Financial Resource Strain: Not on file  Food Insecurity: No Food  Insecurity (10/24/2023)   Hunger Vital Sign    Worried About Running Out of Food in the Last Year: Never true    Ran Out of Food in the Last Year: Never true  Transportation Needs: No Transportation Needs (10/24/2023)   PRAPARE - Administrator, Civil Service (Medical): No    Lack of Transportation (Non-Medical): No  Physical Activity: Not on file  Stress: Not on file  Social Connections: Not on file   Family History  Problem Relation Age of Onset   Liver disease Father    Liver disease Paternal Grandmother    Colon cancer Neg Hx    Stomach cancer Neg Hx    Pancreatic cancer Neg Hx    Esophageal cancer Neg Hx    Allergies  Allergen Reactions   Prednisone Nausea And Vomiting   Amoxicill-Clarithro-Lansopraz Nausea Only    Other Reaction(s): vomiting, vomiting, vomiting   Prior to Admission medications   Medication Sig Start Date End Date Taking? Authorizing Provider  albuterol (VENTOLIN HFA) 108 (90 Base) MCG/ACT inhaler Inhale 2 puffs into the lungs every 6 (six) hours as needed for wheezing or shortness of breath.   Yes [provider]  carvedilol  (COREG ) 6.25 MG tablet Take 1 tablet (6.25 mg total) by mouth 2 (two) times daily with a meal. 07/13/23  Yes Pyrtle, Gordy HERO, MD  lisinopril (PRINIVIL,ZESTRIL) 10 MG tablet Take 10 mg by mouth daily.   Yes [provider]  metFORMIN (GLUCOPHAGE-XR) 500 MG 24 hr tablet Take 500 mg by mouth 2 (two) times daily.   Yes [provider]  pantoprazole  (PROTONIX ) 40 MG tablet Take 1 tablet (40 mg total) by mouth daily. 02/18/22  Yes Pyrtle, Gordy HERO, MD  vitamin E 180 MG (400 UNITS) capsule Take 400 Units by mouth in the morning and at bedtime.   Yes [provider]  carvedilol  (COREG ) 6.25 MG tablet Take 1 tablet (6.25 mg total) by mouth 2 (two) times daily with a meal. Patient not taking: Reported on 12/01/2023 02/22/23   Pyrtle, Gordy HERO, MD  sildenafil (VIAGRA) 100 MG tablet as needed. 11/27/19   [provider]    ROS: All other systems have been reviewed and were otherwise negative with the exception of those mentioned in the HPI and as above.  Physical Exam: General: Alert, no acute distress Cardiovascular: No pedal edema Respiratory: No cyanosis, no use of accessory musculature GI: No organomegaly, abdomen is soft and non-tender Skin: No lesions in the area of chief complaint Neurologic: Sensation intact distally Psychiatric: Patient is competent for consent with normal mood and affect Lymphatic: No axillary or cervical lymphadenopathy  MUSCULOSKELETAL:  The range of motion of the shoulder is to 140 degrees passively with a relatively firm endpoint. External rotation to 30 versus 60.  Cuff strength is weak with supraspinatus testing. Positive impingement with O'Brien's. AC tenderness to palpation. He has exquisite  pain with rotation of his shoulder.   Imaging: MRI is reviewed and demonstrates a partial thickness articular surface tear of the supraspinatus. There is significant fluid around the biceps, distal clavicle arthrosis with a lateral hanging acromial spur. Additionally, he has thickening of the inferior capsule.   Assessment: Traumatic tear of left rotator cuff  Plan: Plan for Procedure(s): ARTHROSCOPY, SHOULDER, WITH ROTATOR CUFF REPAIR  The risks benefits and alternatives were discussed with the patient including but not limited to the risks of nonoperative treatment, versus surgical intervention including infection, bleeding, nerve injury,  blood clots, cardiopulmonary complications, morbidity, mortality, among others, and they were willing to proceed.   The patient acknowledged the explanation, agreed to proceed with the plan and consent was signed.   Operative Plan: Left shoulder scope with SAD, DCE, BT, possible RCR Discharge Medications: avoid tylenol  DVT Prophylaxis: none Physical Therapy: outpatient Special Discharge needs: Sling (should bring with  him). Rosalita Aleck LOISE Jennye, [place]  12/07/2023 1:33 PM

## 2023-12-07 NOTE — Progress Notes (Signed)
 WBC 2.4, results routed to Dr. Cristy

## 2023-12-12 ENCOUNTER — Inpatient Hospital Stay: Admitting: Hematology and Oncology

## 2023-12-12 ENCOUNTER — Other Ambulatory Visit: Payer: Self-pay

## 2023-12-12 ENCOUNTER — Inpatient Hospital Stay

## 2023-12-12 ENCOUNTER — Inpatient Hospital Stay: Attending: Hematology and Oncology

## 2023-12-12 ENCOUNTER — Telehealth: Payer: Self-pay

## 2023-12-12 DIAGNOSIS — D696 Thrombocytopenia, unspecified: Secondary | ICD-10-CM

## 2023-12-12 LAB — CBC WITH DIFFERENTIAL (CANCER CENTER ONLY)
Abs Immature Granulocytes: 0 K/uL (ref 0.00–0.07)
Basophils Absolute: 0 K/uL (ref 0.0–0.1)
Basophils Relative: 0 %
Eosinophils Absolute: 0.1 K/uL (ref 0.0–0.5)
Eosinophils Relative: 5 %
HCT: 36.1 % — ABNORMAL LOW (ref 39.0–52.0)
Hemoglobin: 13.2 g/dL (ref 13.0–17.0)
Immature Granulocytes: 0 %
Lymphocytes Relative: 34 %
Lymphs Abs: 0.8 K/uL (ref 0.7–4.0)
MCH: 31.4 pg (ref 26.0–34.0)
MCHC: 36.6 g/dL — ABNORMAL HIGH (ref 30.0–36.0)
MCV: 85.7 fL (ref 80.0–100.0)
Monocytes Absolute: 0.3 K/uL (ref 0.1–1.0)
Monocytes Relative: 11 %
Neutro Abs: 1.2 K/uL — ABNORMAL LOW (ref 1.7–7.7)
Neutrophils Relative %: 50 %
Platelet Count: 49 K/uL — ABNORMAL LOW (ref 150–400)
RBC: 4.21 MIL/uL — ABNORMAL LOW (ref 4.22–5.81)
RDW: 13.8 % (ref 11.5–15.5)
WBC Count: 2.4 K/uL — ABNORMAL LOW (ref 4.0–10.5)
nRBC: 0 % (ref 0.0–0.2)

## 2023-12-12 LAB — TYPE AND SCREEN
ABO/RH(D): O NEG
Antibody Screen: NEGATIVE

## 2023-12-12 LAB — CBC (CANCER CENTER ONLY)
HCT: 35 % — ABNORMAL LOW (ref 39.0–52.0)
Hemoglobin: 12.7 g/dL — ABNORMAL LOW (ref 13.0–17.0)
MCH: 31.4 pg (ref 26.0–34.0)
MCHC: 36.3 g/dL — ABNORMAL HIGH (ref 30.0–36.0)
MCV: 86.6 fL (ref 80.0–100.0)
Platelet Count: 59 K/uL — ABNORMAL LOW (ref 150–400)
RBC: 4.04 MIL/uL — ABNORMAL LOW (ref 4.22–5.81)
RDW: 13.8 % (ref 11.5–15.5)
WBC Count: 2.7 K/uL — ABNORMAL LOW (ref 4.0–10.5)
nRBC: 0 % (ref 0.0–0.2)

## 2023-12-12 LAB — SAMPLE TO BLOOD BANK

## 2023-12-12 LAB — ABO/RH: ABO/RH(D): O NEG

## 2023-12-12 MED ORDER — SODIUM CHLORIDE 0.9% IV SOLUTION
250.0000 mL | INTRAVENOUS | Status: DC
  Administered 2023-12-12: 100 mL via INTRAVENOUS

## 2023-12-12 MED ORDER — DIPHENHYDRAMINE HCL 25 MG PO CAPS
25.0000 mg | ORAL_CAPSULE | Freq: Once | ORAL | Status: AC
  Administered 2023-12-12: 25 mg via ORAL
  Filled 2023-12-12: qty 1

## 2023-12-12 NOTE — Progress Notes (Signed)
 Per patient, Tylenol  contraindicated 10 days prior to surgery.  Per Dr. Odean, OK to proceed with platelet infusion today with patient only receiving Benadryl , and not Tylenol , as pre-medication.

## 2023-12-12 NOTE — Telephone Encounter (Signed)
 Received confirmation of successful fax transmission of lab results.

## 2023-12-12 NOTE — Patient Instructions (Signed)
 Thrombocytopenia Thrombocytopenia means that you have a low number of platelets in your blood. Platelets are tiny cells in the blood. When you bleed, they clump together at the cut or injury to stop the bleeding. This is called blood clotting. If you do not have enough platelets, your blood may have trouble clotting. This may cause you to bleed and bruise very easily. What are the causes? This condition is caused by a low number of platelets in your blood. There are three main reasons for this: Your body not making enough platelets. This may be caused by: Bone marrow diseases. Disorders that are passed from parent to child (inherited). Certain cancer medicines or treatments. Infection from germs (bacteria or viruses). Alcoholism. Platelets not being released in the blood. This can be caused by: Having a spleen that is larger than normal. A condition called Gaucher disease. Your body destroying platelets too quickly. This may be caused by: Certain autoimmune diseases. Some medicines that thin your blood. Certain blood clotting disorders. Certain bleeding disorders. Exposure to harmful (toxic) chemicals. Pregnancy. What are the signs or symptoms? Bruising easily. Bleeding from the nose or mouth. Heavy menstrual periods. Blood in the pee (urine), poop (stool), or vomit. A purple-red color to the skin (purpura). A rash that looks like pinpoint, purple-red spots (petechiae) on the lower legs. How is this treated? Treatment depends on the cause. Treatment may include: Treatment of another condition that is causing the low platelet count. Medicines to help protect your platelets from being destroyed. A replacement (transfusion) of platelets to stop or prevent bleeding. Surgery to take out the spleen. Follow these instructions at home: Medicines Take over-the-counter and prescription medicines only as told by your doctor. Do not take any medicines that have aspirin or NSAIDs, such as  ibuprofen. Activity Avoid doing things that could hurt or bruise you. Take action to prevent falls. Do not play contact sports. Ask your doctor what activities are safe for you. Take care not to burn yourself: When you use an iron. When you cook. Take care not to cut yourself: When you shave. When you use scissors, needles, knives, or other tools. General instructions  Check your skin and the inside of your mouth for bruises or blood as told by your doctor. Wear a medical alert bracelet that says that you have a bleeding disorder. Check to see if there is blood in your pee and poop. Do this as told by your doctor. Do not drink alcohol. If you do drink, limit the amount that you drink. Stay away from harmful (toxic) chemicals. Tell all of your doctors that you have this condition. Be sure to tell your dentist and eye doctor. Tell your dentist about your condition before you have your teeth cleaned. Keep all follow-up visits. Contact a doctor if: You have bruises and you do not know why. You have new symptoms. You have symptoms that get worse. You have a fever. Get help right away if: You have very bad bleeding anywhere on your body. You have blood in your vomit, pee, or poop. You have an injury to your head. You have a sudden, very bad headache. Summary Thrombocytopenia means that you have a low number of platelets in your blood. Platelets stick together to form a clot. Symptoms of this condition include getting bruises easily, bleeding from the mouth and nose, a purple-red color to the skin, and a rash. Take care not to cut or burn yourself. This information is not intended to replace advice given  to you by your health care provider. Make sure you discuss any questions you have with your health care provider. Document Revised: 06/19/2020 Document Reviewed: 06/19/2020 Elsevier Patient Education  2024 [employer].

## 2023-12-13 ENCOUNTER — Inpatient Hospital Stay

## 2023-12-13 ENCOUNTER — Encounter (HOSPITAL_COMMUNITY): Payer: Self-pay

## 2023-12-13 LAB — PREPARE PLATELET PHERESIS
Unit division: 0
Unit division: 0

## 2023-12-13 LAB — BPAM PLATELET PHERESIS
Blood Product Expiration Date: 202511242359
Blood Product Expiration Date: 202511242359
ISSUE DATE / TIME: 202511241021
ISSUE DATE / TIME: 202511241114
Unit Type and Rh: 5100
Unit Type and Rh: 6200

## 2023-12-13 NOTE — Progress Notes (Signed)
 Case: [id number] Date/Time: 12/14/23 1015   Procedure: ARTHROSCOPY, SHOULDER, WITH ROTATOR CUFF REPAIR (Left) - Subacromial decompression and biceps tenodesis   Anesthesia type: General   Diagnosis: Traumatic tear of left rotator cuff, initial encounter [S46.012A]   Pre-op diagnosis: Traumatic tear of left rotator cuff   Location: [facility] ROOM 10 / [facility]   Surgeons: Cristy Bonner DASEN, MD        DISCUSSION: Barclay Lennox is a 48 year old male with history of hypertension, cirrhosis with portal venous hypertension, esophageal varices, thrombocytopenia, diabetes (A1c 7)  Patient with history of cirrhosis.  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  He is followed by heme-onc.  He is getting platelet transfusions prior to surgery on 11/24.  Most recent platelets on 11/24 are 59  Patient follows with GI for MASH cirrhosis. INR 1.4 on 4/15  VS: BP 115/87   Pulse 76   Temp 36.8 C (Oral)   Resp 16   Ht 6' 1 (1.854 m)   Wt 121.9 kg   SpO2 100%   BMI 35.46 kg/m   PROVIDERS: Arloa Elsie SAUNDERS, MD   LABS: Reviewed. Labs stable. (all labs ordered are listed, but only abnormal results are displayed)  Labs Reviewed  HEMOGLOBIN A1C - Abnormal; Notable for the following components:      Result Value   Hgb A1c MFr Bld 7.0 (*)    All other components within normal limits  CBC - Abnormal; Notable for the following components:   WBC 2.4 (*)    HCT 37.6 (*)    Platelets 54 (*)    All other components within normal limits  COMPREHENSIVE METABOLIC PANEL WITH GFR - Abnormal; Notable for the following components:   Glucose, Bld 155 (*)    Alkaline Phosphatase 179 (*)    Total Bilirubin 2.3 (*)    All other components within normal limits     IMAGES:   EKG:   CV:  Past Medical History:  Diagnosis Date   Asthma    Cirrhosis (HCC)    COVID-19 12/2019   Diabetes (HCC)    Esophageal varices (HCC)    Family history of adverse reaction to anesthesia    mother PONV    Hypertension    Portal venous hypertension (HCC)    Renal cyst    Thrombocytopenia     Past Surgical History:  Procedure Laterality Date   COLONOSCOPY     HERNIA REPAIR     UPPER GASTROINTESTINAL ENDOSCOPY     WISDOM TOOTH EXTRACTION      MEDICATIONS:  albuterol (VENTOLIN HFA) 108 (90 Base) MCG/ACT inhaler   carvedilol  (COREG ) 6.25 MG tablet   carvedilol  (COREG ) 6.25 MG tablet   lisinopril (PRINIVIL,ZESTRIL) 10 MG tablet   metFORMIN (GLUCOPHAGE-XR) 500 MG 24 hr tablet   pantoprazole  (PROTONIX ) 40 MG tablet   sildenafil (VIAGRA) 100 MG tablet   vitamin E 180 MG (400 UNITS) capsule    0.9 %  sodium chloride  infusion   Burnard CHRISTELLA Odis DEVONNA [facility] Surgical Short Stay/Anesthesiology [facility] Phone [phone] 12/13/2023 9:11 AM

## 2023-12-13 NOTE — Anesthesia Preprocedure Evaluation (Signed)
 Anesthesia Evaluation  Patient identified by MRN, date of birth, ID band Patient awake    Reviewed: Allergy & Precautions, NPO status , Patient's Chart, lab work & pertinent test results, reviewed documented beta blocker date and time   History of Anesthesia Complications (+) PONV, Family history of anesthesia reaction and history of anesthetic complications  Airway Mallampati: II  TM Distance: >3 FB Neck ROM: Full    Dental no notable dental hx. (+) Teeth Intact, Dental Advisory Given, Caps   Pulmonary asthma    Pulmonary exam normal breath sounds clear to auscultation       Cardiovascular hypertension, Pt. on medications and Pt. on home beta blockers Normal cardiovascular exam Rhythm:Regular Rate:Normal     Neuro/Psych negative neurological ROS  negative psych ROS   GI/Hepatic negative GI ROS,,,(+) Cirrhosis   Esophageal Varices    Hx/o portal HTN   Endo/Other  diabetes, Well Controlled, Type 2, Oral Hypoglycemic Agents  Obesity   Renal/GU Renal disease  negative genitourinary   Musculoskeletal Rotator cuff tear left shoulder   Abdominal  (+) + obese  Peds  Hematology  (+) Blood dyscrasia, anemia Lab Results      Component                Value               Date                      WBC                      2.7 (L)             12/12/2023                HGB                      12.7 (L)            12/12/2023                HCT                      35.0 (L)            12/12/2023                MCV                      86.6                12/12/2023                PLT                      59 (L)              12/12/2023               Anesthesia Other Findings   Reproductive/Obstetrics                              Anesthesia Physical Anesthesia Plan  ASA: 3  Anesthesia Plan: General   Post-op Pain Management: Regional block* and Minimal or no pain anticipated   Induction:  Intravenous  PONV Risk Score and Plan: 3 and Treatment may vary due to age or medical condition, Ondansetron , Dexamethasone, TIVA  and Propofol  infusion  Airway Management Planned: Oral ETT  Additional Equipment: None  Intra-op Plan:   Post-operative Plan: Extubation in OR  Informed Consent: I have reviewed the patients History and Physical, chart, labs and discussed the procedure including the risks, benefits and alternatives for the proposed anesthesia with the patient or authorized representative who has indicated his/her understanding and acceptance.     Dental advisory given  Plan Discussed with: CRNA and Anesthesiologist  Anesthesia Plan Comments: (See PAT note from 11/19)         Anesthesia Quick Evaluation

## 2023-12-14 ENCOUNTER — Ambulatory Visit (HOSPITAL_COMMUNITY)
Admission: RE | Admit: 2023-12-14 | Discharge: 2023-12-14 | Disposition: A | Discharge status: Home / Self Care | Attending: Orthopaedic Surgery | Admitting: Orthopaedic Surgery

## 2023-12-14 ENCOUNTER — Other Ambulatory Visit: Payer: Self-pay | Admitting: *Deleted

## 2023-12-14 ENCOUNTER — Ambulatory Visit (HOSPITAL_COMMUNITY): Admitting: Anesthesiology

## 2023-12-14 ENCOUNTER — Other Ambulatory Visit: Payer: Self-pay

## 2023-12-14 ENCOUNTER — Encounter (HOSPITAL_COMMUNITY)
Admission: RE | Disposition: A | Discharge status: Home / Self Care | Payer: Self-pay | Source: Home / Self Care | Attending: Orthopaedic Surgery

## 2023-12-14 ENCOUNTER — Encounter (HOSPITAL_COMMUNITY): Payer: Self-pay | Admitting: Orthopaedic Surgery

## 2023-12-14 ENCOUNTER — Ambulatory Visit (HOSPITAL_COMMUNITY): Payer: Self-pay | Admitting: Physician Assistant

## 2023-12-14 DIAGNOSIS — Z7951 Long term (current) use of inhaled steroids: Secondary | ICD-10-CM | POA: Insufficient documentation

## 2023-12-14 DIAGNOSIS — Z79899 Other long term (current) drug therapy: Secondary | ICD-10-CM | POA: Insufficient documentation

## 2023-12-14 DIAGNOSIS — D649 Anemia, unspecified: Secondary | ICD-10-CM | POA: Diagnosis not present

## 2023-12-14 DIAGNOSIS — E669 Obesity, unspecified: Secondary | ICD-10-CM | POA: Diagnosis not present

## 2023-12-14 DIAGNOSIS — M75102 Unspecified rotator cuff tear or rupture of left shoulder, not specified as traumatic: Secondary | ICD-10-CM

## 2023-12-14 DIAGNOSIS — Z87891 Personal history of nicotine dependence: Secondary | ICD-10-CM | POA: Diagnosis not present

## 2023-12-14 DIAGNOSIS — M7542 Impingement syndrome of left shoulder: Secondary | ICD-10-CM | POA: Diagnosis not present

## 2023-12-14 DIAGNOSIS — K766 Portal hypertension: Secondary | ICD-10-CM | POA: Diagnosis not present

## 2023-12-14 DIAGNOSIS — I10 Essential (primary) hypertension: Secondary | ICD-10-CM | POA: Insufficient documentation

## 2023-12-14 DIAGNOSIS — E119 Type 2 diabetes mellitus without complications: Secondary | ICD-10-CM

## 2023-12-14 DIAGNOSIS — Z7984 Long term (current) use of oral hypoglycemic drugs: Secondary | ICD-10-CM | POA: Diagnosis not present

## 2023-12-14 DIAGNOSIS — S46012A Strain of muscle(s) and tendon(s) of the rotator cuff of left shoulder, initial encounter: Secondary | ICD-10-CM | POA: Diagnosis present

## 2023-12-14 DIAGNOSIS — M19012 Primary osteoarthritis, left shoulder: Secondary | ICD-10-CM | POA: Insufficient documentation

## 2023-12-14 DIAGNOSIS — D696 Thrombocytopenia, unspecified: Secondary | ICD-10-CM

## 2023-12-14 DIAGNOSIS — M7502 Adhesive capsulitis of left shoulder: Secondary | ICD-10-CM | POA: Insufficient documentation

## 2023-12-14 DIAGNOSIS — M7522 Bicipital tendinitis, left shoulder: Secondary | ICD-10-CM | POA: Diagnosis not present

## 2023-12-14 DIAGNOSIS — J45909 Unspecified asthma, uncomplicated: Secondary | ICD-10-CM | POA: Diagnosis not present

## 2023-12-14 DIAGNOSIS — I8510 Secondary esophageal varices without bleeding: Secondary | ICD-10-CM | POA: Diagnosis not present

## 2023-12-14 DIAGNOSIS — K7460 Unspecified cirrhosis of liver: Secondary | ICD-10-CM | POA: Insufficient documentation

## 2023-12-14 DIAGNOSIS — S43432A Superior glenoid labrum lesion of left shoulder, initial encounter: Secondary | ICD-10-CM | POA: Diagnosis not present

## 2023-12-14 DIAGNOSIS — Z6835 Body mass index (BMI) 35.0-35.9, adult: Secondary | ICD-10-CM | POA: Insufficient documentation

## 2023-12-14 DIAGNOSIS — X58XXXA Exposure to other specified factors, initial encounter: Secondary | ICD-10-CM | POA: Diagnosis not present

## 2023-12-14 HISTORY — PX: SHOULDER ARTHROSCOPY WITH ROTATOR CUFF REPAIR: SHX5685

## 2023-12-14 LAB — BASIC METABOLIC PANEL WITH GFR
Anion gap: 8 (ref 5–15)
BUN: 13 mg/dL (ref 6–20)
CO2: 24 mmol/L (ref 22–32)
Calcium: 8.3 mg/dL — ABNORMAL LOW (ref 8.9–10.3)
Chloride: 108 mmol/L (ref 98–111)
Creatinine, Ser: 0.96 mg/dL (ref 0.61–1.24)
GFR, Estimated: >60 mL/min (ref >60)
Glucose, Bld: 181 mg/dL — ABNORMAL HIGH (ref 70–99)
Potassium: 4.3 mmol/L (ref 3.5–5.1)
Sodium: 140 mmol/L (ref 135–145)

## 2023-12-14 LAB — GLUCOSE, CAPILLARY
Glucose-Capillary: 205 mg/dL — ABNORMAL HIGH (ref 70–99)
Glucose-Capillary: 164 mg/dL — ABNORMAL HIGH (ref 70–99)

## 2023-12-14 SURGERY — ARTHROSCOPY, SHOULDER, WITH ROTATOR CUFF REPAIR
Anesthesia: General | Site: Shoulder | Laterality: Left

## 2023-12-14 MED ORDER — LIDOCAINE HCL (CARDIAC) PF 100 MG/5ML IV SOSY
PREFILLED_SYRINGE | INTRAVENOUS | Status: DC | PRN
  Administered 2023-12-14: 60 mg via INTRAVENOUS

## 2023-12-14 MED ORDER — ACETAMINOPHEN 500 MG PO TABS
1000.0000 mg | ORAL_TABLET | Freq: Once | ORAL | Status: AC
  Administered 2023-12-14: 1000 mg via ORAL

## 2023-12-14 MED ORDER — PROPOFOL 10 MG/ML IV BOLUS
INTRAVENOUS | Status: DC | PRN
  Administered 2023-12-14: 200 mg via INTRAVENOUS

## 2023-12-14 MED ORDER — ORAL CARE MOUTH RINSE: 15.0000 mL | Freq: Once | OROMUCOSAL | Status: AC

## 2023-12-14 MED ORDER — ONDANSETRON HCL 4 MG/2ML IJ SOLN: 4.0000 mg | Freq: Once | INTRAMUSCULAR | Status: DC | PRN

## 2023-12-14 MED ORDER — BUPIVACAINE HCL (PF) 0.5 % IJ SOLN
INTRAMUSCULAR | Status: DC | PRN
  Administered 2023-12-14: 20 mL via PERINEURAL

## 2023-12-14 MED ORDER — PHENYLEPHRINE HCL-NACL 20-0.9 MG/250ML-% IV SOLN
INTRAVENOUS | Status: DC | PRN
  Administered 2023-12-14: 35 ug/min via INTRAVENOUS

## 2023-12-14 MED ORDER — OXYCODONE HCL 5 MG/5ML PO SOLN: 5.0000 mg | Freq: Once | ORAL | Status: DC | PRN

## 2023-12-14 MED ORDER — DROPERIDOL 2.5 MG/ML IJ SOLN: 0.6250 mg | Freq: Once | INTRAMUSCULAR | Status: DC | PRN

## 2023-12-14 MED ORDER — CEFAZOLIN SODIUM-DEXTROSE 3-4 GM/150ML-% IV SOLN
3.0000 g | INTRAVENOUS | Status: AC
  Administered 2023-12-14: 3 g via INTRAVENOUS
  Filled 2023-12-14: qty 150

## 2023-12-14 MED ORDER — TRANEXAMIC ACID-NACL 1000-0.7 MG/100ML-% IV SOLN
1000.0000 mg | INTRAVENOUS | Status: AC
  Administered 2023-12-14: 1000 mg via INTRAVENOUS
  Filled 2023-12-14: qty 100

## 2023-12-14 MED ORDER — SUGAMMADEX SODIUM 200 MG/2ML IV SOLN
INTRAVENOUS | Status: AC
  Filled 2023-12-14: qty 4

## 2023-12-14 MED ORDER — HYDROMORPHONE HCL 1 MG/ML IJ SOLN: 0.2500 mg | INTRAMUSCULAR | Status: DC | PRN

## 2023-12-14 MED ORDER — PHENYLEPHRINE HCL (PRESSORS) 10 MG/ML IV SOLN
INTRAVENOUS | Status: AC
Start: 2023-12-14 — End: 2023-12-14
  Filled 2023-12-14: qty 1

## 2023-12-14 MED ORDER — CHLORHEXIDINE GLUCONATE 0.12 % MT SOLN
15.0000 mL | Freq: Once | OROMUCOSAL | Status: AC
  Administered 2023-12-14: 15 mL via OROMUCOSAL

## 2023-12-14 MED ORDER — FENTANYL CITRATE (PF) 50 MCG/ML IJ SOSY
25.0000 ug | PREFILLED_SYRINGE | INTRAMUSCULAR | Status: AC
  Administered 2023-12-14: 50 ug via INTRAVENOUS
  Filled 2023-12-14: qty 2

## 2023-12-14 MED ORDER — LACTATED RINGERS IV SOLN
INTRAVENOUS | Status: DC | PRN
Start: 2023-12-14 — End: 2023-12-14

## 2023-12-14 MED ORDER — ONDANSETRON HCL 4 MG/2ML IJ SOLN
INTRAMUSCULAR | Status: DC | PRN
  Administered 2023-12-14: 4 mg via INTRAVENOUS

## 2023-12-14 MED ORDER — LACTATED RINGERS IV SOLN: INTRAVENOUS | Status: DC

## 2023-12-14 MED ORDER — MIDAZOLAM HCL (PF) 2 MG/2ML IJ SOLN
0.5000 mg | INTRAMUSCULAR | Status: AC
  Administered 2023-12-14: 1 mg via INTRAVENOUS
  Filled 2023-12-14: qty 2

## 2023-12-14 MED ORDER — PHENYLEPHRINE HCL (PRESSORS) 10 MG/ML IV SOLN
INTRAVENOUS | Status: AC
  Filled 2023-12-14: qty 1

## 2023-12-14 MED ORDER — SODIUM CHLORIDE 0.9 % IR SOLN
Status: DC | PRN
Start: 2023-12-14 — End: 2023-12-14
  Administered 2023-12-14: 6000 mL

## 2023-12-14 MED ORDER — ONDANSETRON HCL 4 MG PO TABS: 4.0000 mg | ORAL_TABLET | Freq: Three times a day (TID) | ORAL | 0 refills | Status: AC | PRN

## 2023-12-14 MED ORDER — BUPIVACAINE LIPOSOME 1.3 % IJ SUSP
INTRAMUSCULAR | Status: DC | PRN
  Administered 2023-12-14: 10 mL via PERINEURAL

## 2023-12-14 MED ORDER — INSULIN ASPART 100 UNIT/ML IJ SOLN
0.0000 [IU] | INTRAMUSCULAR | Status: DC | PRN
  Administered 2023-12-14: 4 [IU] via SUBCUTANEOUS
  Filled 2023-12-14: qty 4

## 2023-12-14 MED ORDER — PROPOFOL 10 MG/ML IV BOLUS
INTRAVENOUS | Status: AC
  Filled 2023-12-14: qty 20

## 2023-12-14 MED ORDER — ROCURONIUM BROMIDE 100 MG/10ML IV SOLN
INTRAVENOUS | Status: DC | PRN
  Administered 2023-12-14: 60 mg via INTRAVENOUS

## 2023-12-14 MED ORDER — OXYCODONE HCL 5 MG PO TABS: 5.0000 mg | ORAL_TABLET | Freq: Once | ORAL | Status: DC | PRN

## 2023-12-14 MED ORDER — SUGAMMADEX SODIUM 200 MG/2ML IV SOLN
INTRAVENOUS | Status: DC | PRN
  Administered 2023-12-14: 300 mg via INTRAVENOUS

## 2023-12-14 MED ORDER — OXYCODONE HCL 5 MG PO TABS: ORAL_TABLET | ORAL | 0 refills | Status: DC

## 2023-12-14 MED ORDER — MELOXICAM 15 MG PO TABS: 15.0000 mg | ORAL_TABLET | Freq: Every day | ORAL | 0 refills | Status: AC

## 2023-12-14 SURGICAL SUPPLY — 56 items
ANCHOR SUT 1.8 FIBERTAK SB KL (Anchor) IMPLANT
BAG COUNTER SPONGE SURGICOUNT (BAG) ×2 IMPLANT
BLADE EXCALIBUR 4.0X13 (MISCELLANEOUS) ×1 IMPLANT
BLADE SHAVER BONE 5.0X13 (MISCELLANEOUS) IMPLANT
BLADE SURG SZ10 CARB STEEL (BLADE) IMPLANT
BURR OVAL 8 FLU 4.0X13 (MISCELLANEOUS) ×1 IMPLANT
CANNULA 5.75X71 LONG (CANNULA) IMPLANT
CANNULA PASSPORT 5 (CANNULA) IMPLANT
CANNULA PASSPORT BUTTON 8X4 (CANNULA) IMPLANT
CANNULA TWIST IN 8.25X7CM (CANNULA) IMPLANT
CHLORAPREP W/TINT 26 (MISCELLANEOUS) ×1 IMPLANT
CLSR STERI-STRIP ANTIMIC 1/2X4 (GAUZE/BANDAGES/DRESSINGS) ×2 IMPLANT
COOLER ICEMAN CLASSIC (MISCELLANEOUS) ×2 IMPLANT
DISSECTOR 3.5MM X 13CM CVD (MISCELLANEOUS) IMPLANT
DISSECTOR 4.0MMX13CM CVD (MISCELLANEOUS) IMPLANT
DRAPE IMP U-DRAPE 54X76 (DRAPES) ×1 IMPLANT
DRAPE SHEET LG 3/4 BI-LAMINATE (DRAPES) ×1 IMPLANT
DRAPE SHOULDER BEACH CHAIR (DRAPES) ×1 IMPLANT
DW OUTFLOW CASSETTE/TUBE SET (MISCELLANEOUS) ×1 IMPLANT
ELECT NDL TIP 2.8 STRL (NEEDLE) IMPLANT
ELECT NEEDLE TIP 2.8 STRL (NEEDLE) IMPLANT
ELECT PENCIL ROCKER SW 15FT (MISCELLANEOUS) IMPLANT
ELECT REM PT RETURN 15FT ADLT (MISCELLANEOUS) IMPLANT
GAUZE PAD ABD 8X10 STRL (GAUZE/BANDAGES/DRESSINGS) ×6 IMPLANT
GAUZE SPONGE 4X4 12PLY STRL (GAUZE/BANDAGES/DRESSINGS) ×2 IMPLANT
GLOVE BIO SURGEON STRL SZ 6.5 (GLOVE) ×1 IMPLANT
GLOVE BIOGEL PI IND STRL 6.5 (GLOVE) ×1 IMPLANT
GLOVE BIOGEL PI IND STRL 8 (GLOVE) ×1 IMPLANT
GLOVE ECLIPSE 8.0 STRL XLNG CF (GLOVE) ×2 IMPLANT
GOWN STRL REUS W/ TWL LRG LVL3 (GOWN DISPOSABLE) ×2 IMPLANT
KIT BASIN OR (CUSTOM PROCEDURE TRAY) ×2 IMPLANT
KIT STABILIZATION SHOULDER (MISCELLANEOUS) ×1 IMPLANT
KIT STR SPEAR 1.8 FBRTK DISP (KITS) IMPLANT
KIT TURNOVER KIT A (KITS) ×2 IMPLANT
LASSO CRESCENT QUICKPASS (SUTURE) IMPLANT
MANIFOLD NEPTUNE II (INSTRUMENTS) ×1 IMPLANT
MAT ABSORB FLUID 56X50 GRAY (MISCELLANEOUS) IMPLANT
NDL HD SCORPION MEGA LOADER (NEEDLE) IMPLANT
NDL TAPERED W/ NITINOL LOOP (MISCELLANEOUS) IMPLANT
NEEDLE TAPERED W/ NITINOL LOOP (MISCELLANEOUS) IMPLANT
PACK ARTHROSCOPY WL (CUSTOM PROCEDURE TRAY) ×2 IMPLANT
PAD COLD SHLDR WRAP-ON (PAD) IMPLANT
RESTRAINT HEAD UNIVERSAL NS (MISCELLANEOUS) ×1 IMPLANT
SPIKE FLUID TRANSFER (MISCELLANEOUS) IMPLANT
SPONGE T-LAP 4X18 ~~LOC~~+RFID (SPONGE) IMPLANT
SUT MNCRL AB 4-0 PS2 18 (SUTURE) ×2 IMPLANT
SUT PDS AB 1 CT 36 (SUTURE) IMPLANT
SUT TIGER TAPE 7 IN WHITE (SUTURE) IMPLANT
SUTURE FIBERWR #2 38 T-5 BLUE (SUTURE) IMPLANT
SUTURE TAPE TIGERLINK 1.3MM BL (SUTURE) IMPLANT
SYR 10ML LL (SYRINGE) IMPLANT
TAPE FIBER 2MM 7IN #2 BLUE (SUTURE) IMPLANT
TOWEL OR DSP ST BLU DLX 10/PK (DISPOSABLE) ×1 IMPLANT
TUBE SUCTION HIGH CAP CLEAR NV (SUCTIONS) IMPLANT
TUBING ARTHROSCOPY IRRIG 16FT (MISCELLANEOUS) ×1 IMPLANT
WAND ABLATOR APOLLO I90 (BUR) ×2 IMPLANT

## 2023-12-14 NOTE — Anesthesia Procedure Notes (Signed)
 Procedure Name: Intubation Date/Time: 12/14/2023 10:31 AM  Performed by: Dartha Meckel, CRNAPre-anesthesia Checklist: Patient identified, Emergency Drugs available, Suction available and Patient being monitored Patient Re-evaluated:Patient Re-evaluated prior to induction Oxygen Delivery Method: Circle system utilized Preoxygenation: Pre-oxygenation with 100% oxygen Induction Type: IV induction Ventilation: Mask ventilation without difficulty Laryngoscope Size: Glidescope and Mac Grade View: Grade I Tube type: Oral Number of attempts: 1 Airway Equipment and Method: Stylet and Oral airway Placement Confirmation: ETT inserted through vocal cords under direct vision, positive ETCO2 and breath sounds checked- equal and bilateral Secured at: 21 cm Tube secured with: Tape Dental Injury: Teeth and Oropharynx as per pre-operative assessment

## 2023-12-14 NOTE — Interval H&P Note (Signed)
 All questions answered, patient wants to proceed with procedure. ? ?

## 2023-12-14 NOTE — Anesthesia Procedure Notes (Signed)
 Anesthesia Regional Block: Interscalene brachial plexus block   Pre-Anesthetic Checklist: , timeout performed,  Correct Patient, Correct Site, Correct Laterality,  Correct Procedure, Correct Position, site marked,  Risks and benefits discussed,  Surgical consent,  Pre-op evaluation,  At surgeon's request and post-op pain management  Laterality: Left  Prep: chloraprep       Needles:  Injection technique: Single-shot  Needle Type: Echogenic Stimulator Needle     Needle Length: 10cm  Needle Gauge: 21   Needle insertion depth: 7 cm   Additional Needles:   Procedures:,,,, ultrasound used (permanent image in chart),,   Motor weakness within 5 minutes.  Narrative:  Start time: 12/14/2023 9:42 AM End time: 12/14/2023 9:47 AM Injection made incrementally with aspirations every 5 mL.  Performed by: Personally  Anesthesiologist: Jerrye Sharper, MD  Additional Notes: Timeout performed. Patient sedated. Relevant anatomy ID'd using US . Incremental 2-5ml injection of LA with frequent aspiration. Patient tolerated procedure well.

## 2023-12-14 NOTE — Discharge Instructions (Signed)
 Bonner Hair MD, MPH Aleck Stalling, PA-C [facility] [address], [address] [phone] (tel)   [phone] (fax)   POST-OPERATIVE INSTRUCTIONS - SHOULDER ARTHROSCOPY  WOUND CARE You may remove the Operative Dressing on Post-Op Day #3 (72hrs after surgery).   Alternatively if you would like you can leave dressing on until follow-up if within 7-8 days but keep it dry. Leave steri-strips in place until they fall off on their own, usually 2 weeks postop. There may be a small amount of fluid/bleeding leaking at the surgical site.  This is normal; the shoulder is filled with fluid during the procedure and can leak for 24-48hrs after surgery.  You may change/reinforce the bandage as needed.  Use the Cryocuff or Ice as often as possible for the first 7 days, then as needed for pain relief. Always keep a towel, ACE wrap or other barrier between the cooling unit and your skin.  You may shower on Post-Op Day #3. Gently pat the area dry.  Do not soak the shoulder in water or submerge it.  Keep incisions as dry as possible. Do not go swimming in the pool or ocean until 4 weeks after surgery or when otherwise instructed.    EXERCISES Wear the sling at all times  You may remove the sling for showering, but keep the arm across the chest or in a secondary sling.     It is normal for your fingers/hand to become more swollen after surgery and discolored from bruising.   This will resolve over the first few weeks usually after surgery. Please continue to ambulate and do not stay sitting or lying for too long.  Perform foot and wrist pumps to assist in circulation.  PHYSICAL THERAPY - You will begin physical therapy soon after surgery (unless otherwise specified) - Please call to set up an appointment, if you do not already have one  - Let our office if there are any issues with scheduling your therapy   - A PT referral was sent to [facility]  REGIONAL ANESTHESIA (NERVE  BLOCKS) The anesthesia team may have performed a nerve block for you this is a great tool used to minimize pain.   The block may start wearing off overnight (between 8-24 hours postop) When the block wears off, your pain may go from nearly zero to the pain you would have had postop without the block. This is an abrupt transition but nothing dangerous is happening.   This can be a challenging period but utilize your as needed pain medications to try and manage this period. We suggest you use the pain medication the first night prior to going to bed, to ease this transition.  You may take an extra dose of narcotic when this happens if needed  POST-OP MEDICATIONS- Multimodal approach to pain control In general your pain will be controlled with a combination of substances.  Prescriptions unless otherwise discussed are electronically sent to your pharmacy.  This is a carefully made plan we use to minimize narcotic use.     Meloxicam  - Anti-inflammatory medication taken on a scheduled basis Oxycodone  - This is a strong narcotic, to be used only on an "as needed" basis for SEVERE pain. Zofran  - take as needed for nausea   FOLLOW-UP If you develop a Fever (>=101.5), Redness or Drainage from the surgical incision site, please call our office to arrange for an evaluation. Please call the office to schedule a follow-up appointment for your first post-operative appointment,  7-10 days post-operatively.    HELPFUL INFORMATION   You may be more comfortable sleeping in a semi-seated position the first few nights following surgery.  Keep a pillow propped under the elbow and forearm for comfort.  If you have a recliner type of chair it might be beneficial.  If not that is fine too, but it would be helpful to sleep propped up with pillows behind your operated shoulder as well under your elbow and forearm.  This will reduce pulling on the suture lines.  When dressing, put your operative arm in the sleeve  first.  When getting undressed, take your operative arm out last.  Loose fitting, button-down shirts are recommended.  Often in the first days after surgery you may be more comfortable keeping your operative arm under your shirt and not through the sleeve.  You may return to work/school in the next couple of days when you feel up to it.  Desk work and typing in the sling is fine.  We suggest you use the pain medication the first night prior to going to bed, in order to ease any pain when the anesthesia wears off. You should avoid taking pain medications on an empty stomach as it will make you nauseous.  You should wean off your narcotic medicines as soon as you are able.  Most patients will be off narcotics before their first postop appointment.  We do not refill narcotics  Do not drink alcoholic beverages or take illicit drugs when taking pain medications.  It is against the law to drive while taking narcotics.  In some states it is against the law to drive while your arm is in a sling.   Pain medication may make you constipated.  Below are a few solutions to try in this order: Decrease the amount of pain medication if you aren't having pain. Drink lots of decaffeinated fluids. Drink prune juice and/or eat dried prunes  If the first 3 don't work start with additional solutions Take Colace - an over-the-counter stool softener Take Senokot - an over-the-counter laxative Take Miralax - a stronger over-the-counter laxative  For more information including helpful videos and documents visit our website:   [email or web address]

## 2023-12-14 NOTE — Transfer of Care (Signed)
 Immediate Anesthesia Transfer of Care Note  Patient: Caleb Baker  Procedure(s) Performed: ARTHROSCOPY, SHOULDER, WITH  SUBACROMIAL DECOMPRESSION, DISTAL CLAVICLE EXCISION, BICEP TENODESIS & ROTATOR CUFF REPAIR (Left: Shoulder)  Patient Location: PACU  Anesthesia Type:General  Level of Consciousness: awake and alert   Airway & Oxygen Therapy: Patient Spontanous Breathing and Patient connected to nasal cannula oxygen  Post-op Assessment: Report given to RN and Post -op Vital signs reviewed and stable  Post vital signs: Reviewed and stable  Last Vitals:  Vitals Value Taken Time  BP    Temp    Pulse 65 12/14/23 11:40  Resp    SpO2 95 % 12/14/23 11:40  Vitals shown include unfiled device data.  Last Pain:  Vitals:   12/14/23 0950  TempSrc:   PainSc: 0-No pain         Complications: No notable events documented.

## 2023-12-14 NOTE — Anesthesia Postprocedure Evaluation (Signed)
 Anesthesia Post Note  Patient: Caleb Baker  Procedure(s) Performed: ARTHROSCOPY, SHOULDER, WITH  SUBACROMIAL DECOMPRESSION, DISTAL CLAVICLE EXCISION, BICEP TENODESIS & ROTATOR CUFF REPAIR (Left: Shoulder)     Patient location during evaluation: PACU Anesthesia Type: General Level of consciousness: awake and alert and oriented Pain management: pain level controlled Vital Signs Assessment: post-procedure vital signs reviewed and stable Respiratory status: spontaneous breathing, nonlabored ventilation and respiratory function stable Cardiovascular status: blood pressure returned to baseline and stable Postop Assessment: no apparent nausea or vomiting Anesthetic complications: no   No notable events documented.  Last Vitals:  Vitals:   12/14/23 1231 12/14/23 1243  BP: 125/79   Pulse: 63   Resp: 14   Temp:    SpO2: 100% 98%    Last Pain:  Vitals:   12/14/23 1231  TempSrc:   PainSc: 0-No pain                 Anuoluwapo Mefferd A.

## 2023-12-14 NOTE — Op Note (Signed)
 Orthopaedic Surgery Operative Note (CSN: [id number])  Caleb Baker  [birth year] Date of Surgery: 12/14/2023   DIAGNOSES: Left shoulder, chronic rotator cuff tear, SLAP tear, biceps tendinitis, AC arthritis, subacromial impingement, and adhesive capsulitis, anterior labral tearing.  POST-OPERATIVE DIAGNOSIS: same  PROCEDURE: Arthroscopic extensive debridement - 29823 Subdeltoid Bursa, Supraspinatus Tendon, Anterior Labrum, and rotator interval and MGHL, capsule Arthroscopic distal clavicle excision - 70175 Arthroscopic subacromial decompression - 70173 Arthroscopic biceps tenodesis - 70171 Arthroscopic lysis of adhesions and manipulation under anesthesia 70174   OPERATIVE FINDING: Patient had limited external rotation to 30 degrees and forward flexion 140 degrees, after release of anterior-inferior capsule and rotator interval external rotation was to 70 and forward flexion to 170.  Patient had intact glenohumeral surfaces, anterior labral tearing, there was significant redness and capsulitis anteriorly.  There is a type II SLAP tear.  Rotator cuff had some fraying on the articular surface that was debrided back about 2 to 3%.  This was supraspinatus.  Bursal side looked to be intact however there is significant bursitis and signs of wear of the CA ligament as well as the subacromial spur.  Distal clavicle arthrosis was noted and he was tender to palpation in the preoperative area so we felt that a distal clavicle excision was appropriate as well.     Post-operative plan: The patient will be non-weightbearing in a sling for up to 4 weeks however he is quite stiff we did use a pass through technique and the patient may discontinue from sling early..  The patient will be discharged home.  DVT prophylaxis not indicated in ambulatory upper extremity patient without known risk factors.   Pain control with PRN pain medication preferring oral medicines.  Follow up plan will be scheduled in  approximately 7 days for incision check and XR.  Surgeons:Primary: Cristy Bonner DASEN, MD Assistants:Caroline McBane, PA-C Location: [facility] ROOM 10 Anesthesia: General with Exparel  interscalene block Antibiotics: Ancef  3 g Tourniquet time: None Estimated Blood Loss: Minimal Complications: None Specimens: None Implants: Implant Name Type Inv. Item Serial No. Manufacturer Lot No. LRB No. Used Action  ANCHOR SUT 1.8 FIBERTAK SB KL - [id number] Anchor ANCHOR SUT 1.8 FIBERTAK SB KL  [employer] [id number] Left 1 Implanted  ANCHOR SUT 1.8 FIBERTAK SB KL - [id number] Anchor ANCHOR SUT 1.8 [employer] [id number] Left 1 Implanted    Indications for Surgery:   Caleb Baker is a 48 y.o. male with continued shoulder pain refractory to nonoperative measures for extended period of time.    The risks and benefits were explained at length including but not limited to continued pain, cuff failure, biceps tenodesis failure, stiffness, need for further surgery and infection.   Procedure:   Patient was correctly identified in the preoperative holding area and operative site marked.  Patient brought to OR and positioned beachchair on an [place] table ensuring that all bony prominences were padded and the head was in an appropriate location.  Anesthesia was induced and the operative shoulder was prepped and draped in the usual sterile fashion.  Timeout was called preincision.  A standard posterior viewing portal was made after localizing the portal with a spinal needle.  An anterior accessory portal was also made.  After clearing the articular space the camera was positioned in the subacromial space.  Findings above.    Extensive debridement is performed of the rotator interval, [facility] L, anterior labrum, subacromial bursa, CA ligament and rotator cuff  Subacromial decompression: We made a  lateral portal with spinal needle guidance. We then proceeded to debride bursal tissue extensively with a shaver and  arthrocare device. At that point we continued to identify the borders of the acromion and identify the spur. We then carefully preserved the deltoid fascia and used a burr to convert the acromion to a Type 1 flat acromion without issue.  Biceps tenodesis: We marked the tendon and then performed a tenotomy and debridement of the stump in the articular space. We then identified the biceps tendon in its groove suprapec with the arthroscope in the lateral portal taking care to move from lateral to medial to avoid injury to the subscapularis. At that point we unroofed the tendon itself and mobilized it. An accessory anterior portal was made in line with the tendon and we grasped it from the anterior superior portal and worked from the accessory anterior portal. Two Fibertak 1.50mm knotless anchors were placed in the groove and the tendon was secured in a luggage loop style fashion with a pass of the limb of suture through the tendon using a scorpion device to avoid pull-through.  Repair was completed with good tension on the tendon.  Residual stump of the tendon was removed after being resected with a RF ablator.  Distal Clavicle resection:  The scope was placed in the subacromial space from the posterior portal.  A hemostat was placed through the anterior portal and we spread at the [facility] joint.  A burr was then inserted and 10 mm of distal clavicle was resected taking care to avoid damage to the capsule around the joint and avoiding overhanging bone posteriorly.    We identified the anterior capsular scarring and thickening.  We released the [facility] L and then released and debrided the anterior capsule to the 6 o'clock position.  Gentle manipulation anesthesia was performed with the above measurements.  The incisions were closed with absorbable monocryl and steri strips.  A sterile dressing was placed along with a sling. The patient was awoken from general anesthesia and taken to the PACU in stable condition without  complication.   Aleck Stalling, PA-C, present and scrubbed throughout the case, critical for completion in a timely fashion, and for retraction, instrumentation, closure.

## 2023-12-16 ENCOUNTER — Encounter (HOSPITAL_COMMUNITY): Payer: Self-pay | Admitting: Orthopaedic Surgery

## 2023-12-19 ENCOUNTER — Inpatient Hospital Stay: Attending: Hematology and Oncology | Admitting: Hematology and Oncology

## 2023-12-19 ENCOUNTER — Inpatient Hospital Stay: Attending: Hematology and Oncology

## 2023-12-19 VITALS — BP 125/69 | HR 72 | Temp 97.9°F | Resp 16 | Ht 73.0 in | Wt 279.3 lb

## 2023-12-19 DIAGNOSIS — D696 Thrombocytopenia, unspecified: Secondary | ICD-10-CM

## 2023-12-19 LAB — CBC WITH DIFFERENTIAL (CANCER CENTER ONLY)
Abs Immature Granulocytes: 0.01 K/uL (ref 0.00–0.07)
Basophils Absolute: 0 K/uL (ref 0.0–0.1)
Basophils Relative: 1 %
Eosinophils Absolute: 0.1 K/uL (ref 0.0–0.5)
Eosinophils Relative: 5 %
HCT: 33.5 % — ABNORMAL LOW (ref 39.0–52.0)
Hemoglobin: 12.2 g/dL — ABNORMAL LOW (ref 13.0–17.0)
Immature Granulocytes: 1 %
Lymphocytes Relative: 34 %
Lymphs Abs: 0.8 K/uL (ref 0.7–4.0)
MCH: 31.8 pg (ref 26.0–34.0)
MCHC: 36.4 g/dL — ABNORMAL HIGH (ref 30.0–36.0)
MCV: 87.2 fL (ref 80.0–100.0)
Monocytes Absolute: 0.2 K/uL (ref 0.1–1.0)
Monocytes Relative: 11 %
Neutro Abs: 1.1 K/uL — ABNORMAL LOW (ref 1.7–7.7)
Neutrophils Relative %: 48 %
Platelet Count: 51 K/uL — ABNORMAL LOW (ref 150–400)
RBC: 3.84 MIL/uL — ABNORMAL LOW (ref 4.22–5.81)
RDW: 13.8 % (ref 11.5–15.5)
WBC Count: 2.2 K/uL — ABNORMAL LOW (ref 4.0–10.5)
nRBC: 0 % (ref 0.0–0.2)

## 2023-12-19 LAB — CMP (CANCER CENTER ONLY)
ALT: 19 U/L (ref 0–44)
AST: 27 U/L (ref 15–41)
Albumin: 3.6 g/dL (ref 3.5–5.0)
Alkaline Phosphatase: 152 U/L — ABNORMAL HIGH (ref 38–126)
Anion gap: 7 (ref 5–15)
BUN: 18 mg/dL (ref 6–20)
CO2: 28 mmol/L (ref 22–32)
Calcium: 8.6 mg/dL — ABNORMAL LOW (ref 8.9–10.3)
Chloride: 106 mmol/L (ref 98–111)
Creatinine: 0.99 mg/dL (ref 0.61–1.24)
GFR, Estimated: >60 mL/min (ref >60)
Glucose, Bld: 143 mg/dL — ABNORMAL HIGH (ref 70–99)
Potassium: 4.5 mmol/L (ref 3.5–5.1)
Sodium: 141 mmol/L (ref 135–145)
Total Bilirubin: 2.5 mg/dL — ABNORMAL HIGH (ref 0.0–1.2)
Total Protein: 6 g/dL — ABNORMAL LOW (ref 6.5–8.1)

## 2023-12-19 NOTE — Assessment & Plan Note (Addendum)
 Chronic thrombocytopenia with platelet counts usually in the 60s-70s, recently 55.  Previous platelet count is 54.   Abdominal ultrasound shows cirrhotic liver morphology. 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  12/12/2023: 2 units of platelets were given (platelets only went from 49-59) 12/14/2023: Left shoulder surgery for chronic rotator cuff tear In spite of low platelets he did not have any major bleeding complications with surgery. No further issues at this time.  Return to clinic on an as-needed basis

## 2023-12-19 NOTE — Progress Notes (Signed)
 Patient Care Team: Caleb Elsie SAUNDERS, MD as PCP - General (Family Medicine)  DIAGNOSIS:  Encounter Diagnosis  Name Primary?   Thrombocytopenia Yes      CHIEF COMPLIANT: Follow-up after recent surgery  HISTORY OF PRESENT ILLNESS:   History of Present Illness Caleb Baker is a 48 year old male who presents with concerns about platelet levels following recent surgery.  He recently underwent surgery and received two bags of platelets, with only a slight rise and no increase to the expected level around 100. His current platelet count is 51. He has had no bleeding during or after the procedure. His primary care physician and Dr. Vernita monitor his blood counts regularly.     ALLERGIES:  is allergic to prednisone and amoxicill-clarithro-lansopraz.  MEDICATIONS:  Current Outpatient Medications  Medication Sig Dispense Refill   albuterol (VENTOLIN HFA) 108 (90 Base) MCG/ACT inhaler Inhale 2 puffs into the lungs every 6 (six) hours as needed for wheezing or shortness of breath.     carvedilol  (COREG ) 6.25 MG tablet Take 1 tablet (6.25 mg total) by mouth 2 (two) times daily with a meal. 180 tablet 3   lisinopril (PRINIVIL,ZESTRIL) 10 MG tablet Take 10 mg by mouth daily.     meloxicam  (MOBIC ) 15 MG tablet Take 1 tablet (15 mg total) by mouth daily. For 2 weeks for pain and inflammation. Then take as needed 30 tablet 0   metFORMIN (GLUCOPHAGE-XR) 500 MG 24 hr tablet Take 500 mg by mouth 2 (two) times daily.     ondansetron  (ZOFRAN ) 4 MG tablet Take 1 tablet (4 mg total) by mouth every 8 (eight) hours as needed for up to 7 days for nausea or vomiting. 10 tablet 0   pantoprazole  (PROTONIX ) 40 MG tablet Take 1 tablet (40 mg total) by mouth daily. 90 tablet 3   sildenafil (VIAGRA) 100 MG tablet as needed.     vitamin E 180 MG (400 UNITS) capsule Take 400 Units by mouth in the morning and at bedtime.     Current Facility-Administered Medications  Medication Dose Route Frequency Provider  Last Rate Last Admin   0.9 %  sodium chloride  infusion  500 mL Intravenous Once Pyrtle, Caleb HERO, MD        PHYSICAL EXAMINATION: ECOG PERFORMANCE STATUS: 1 - Symptomatic but completely ambulatory  Vitals:   12/19/23 0843  BP: 125/69  Pulse: 72  Resp: 16  Temp: 97.9 F (36.6 C)  SpO2: 98%   Filed Weights   12/19/23 0843  Weight: 279 lb 4.8 oz (126.7 kg)     LABORATORY DATA:  I have reviewed the data as listed    Latest Ref Rng & Units 12/14/2023   11:58 AM 12/07/2023    8:30 AM 05/03/2023    4:05 PM  CMP  Glucose 70 - 99 mg/dL 818  844  773   BUN 6 - 20 mg/dL 13  11  16    Creatinine 0.61 - 1.24 mg/dL 9.03  8.96  8.99   Sodium 135 - 145 mmol/L 140  140  137   Potassium 3.5 - 5.1 mmol/L 4.3  4.8  4.1   Chloride 98 - 111 mmol/L 108  107  106   CO2 22 - 32 mmol/L 24  24  26    Calcium 8.9 - 10.3 mg/dL 8.3  9.1  9.0   Total Protein 6.5 - 8.1 g/dL  6.5  6.3   Total Bilirubin 0.0 - 1.2 mg/dL  2.3  3.0  Alkaline Phos 38 - 126 U/L  179  132   AST 15 - 41 U/L  31  22   ALT 0 - 44 U/L  22  17     Lab Results  Component Value Date   WBC 2.2 (L) 12/19/2023   HGB 12.2 (L) 12/19/2023   HCT 33.5 (L) 12/19/2023   MCV 87.2 12/19/2023   PLT 51 (L) 12/19/2023   NEUTROABS 1.1 (L) 12/19/2023    ASSESSMENT & PLAN:  Thrombocytopenia Chronic thrombocytopenia with platelet counts usually in the 60s-70s, recently 55.  Previous platelet count is 54.   Abdominal ultrasound shows cirrhotic liver morphology. 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 Because her platelet count did not improve after receiving platelet transfusion, it suggests either ITP or platelet sequestration.  12/12/2023: 2 units of platelets were given (platelets only went from 49 to 59) 12/14/2023: Left shoulder surgery for chronic rotator cuff tear In spite of low platelets he did not have any major bleeding complications with surgery. No further issues at this time.  Return to clinic on an as-needed  basis If his platelets were to drop below 30 I would like to see him back again to discuss getting a bone marrow biopsy. If he has any persistent bleeding symptoms he will need to be reevaluated.  Assessment & Plan Thrombocytopenia Platelet count stable at 51,000 post-surgery despite transfusion. Possible splenic sequestration or immunological cause. ITP unlikely. No bleeding complications. - Monitor platelet count with primary care provider 1-2 times/year. - Seek emergency care for uncontrollable bleeding. - Return for evaluation if platelet count <20,000.      No orders of the defined types were placed in this encounter.  The patient has a good understanding of the overall plan. he agrees with it. he will call with any problems that may develop before the next visit here.  I personally spent a total of 30 minutes in the care of the patient today including preparing to see the patient, getting/reviewing separately obtained history, performing a medically appropriate exam/evaluation, counseling and educating, placing orders, referring and communicating with other health care professionals, documenting clinical information in the EHR, independently interpreting results, communicating results, and coordinating care.   Viinay K Neely Kammerer, MD 12/19/23

## 2023-12-20 ENCOUNTER — Inpatient Hospital Stay

## 2024-01-09 ENCOUNTER — Encounter: Payer: Self-pay | Admitting: *Deleted

## 2024-02-29 ENCOUNTER — Ambulatory Visit: Admitting: Internal Medicine
